# Patient Record
Sex: Male | Born: 1935 | Race: White | Hispanic: No | Smoking: Never smoker
Health system: Southern US, Community
[De-identification: ages and names within clinical notes are randomized; demographics above are authoritative.]

## PROBLEM LIST (undated history)

## (undated) DIAGNOSIS — C689 Malignant neoplasm of urinary organ, unspecified: Secondary | ICD-10-CM

## (undated) DIAGNOSIS — N400 Enlarged prostate without lower urinary tract symptoms: Secondary | ICD-10-CM

## (undated) DIAGNOSIS — I480 Paroxysmal atrial fibrillation: Secondary | ICD-10-CM

## (undated) DIAGNOSIS — H409 Unspecified glaucoma: Secondary | ICD-10-CM

## (undated) DIAGNOSIS — N189 Chronic kidney disease, unspecified: Secondary | ICD-10-CM

## (undated) DIAGNOSIS — M48061 Spinal stenosis, lumbar region without neurogenic claudication: Secondary | ICD-10-CM

## (undated) DIAGNOSIS — I10 Essential (primary) hypertension: Secondary | ICD-10-CM

## (undated) DIAGNOSIS — E119 Type 2 diabetes mellitus without complications: Secondary | ICD-10-CM

## (undated) DIAGNOSIS — N184 Chronic kidney disease, stage 4 (severe): Secondary | ICD-10-CM

## (undated) HISTORY — DX: Type 2 diabetes mellitus without complications: E11.9

## (undated) HISTORY — PX: EYE SURGERY: SHX253

## (undated) HISTORY — PX: APPENDECTOMY: SHX54

## (undated) HISTORY — PX: TRIGGER FINGER RELEASE: SHX641

## (undated) HISTORY — PX: KNEE SURGERY: SHX244

---

## 1994-04-16 HISTORY — PX: SHOULDER SURGERY: SHX246

## 2008-03-21 ENCOUNTER — Emergency Department: Payer: Self-pay | Admitting: Emergency Medicine

## 2008-03-21 IMAGING — CR DG HUMERUS 2V *L*
1 series · 3 of 3 positions shown · non-contrast
Comparison: none

REASON FOR EXAM: pain, mid shaft deformity
COMMENTS:

PROCEDURE:     DXR - DXR HUMERUS LEFT  - [DATE] [DATE]
RESULT:     A transverse fracture is identified along the midhumeral shaft.
Distal fracture fragment demonstrates lateral angulation and minimal lateral
displacement.  There is approximately 3-6 mm of distraction.

[Series 1: view not recorded · 0.17mm/px · 3 of 3 slices shown]
[im 1/3]
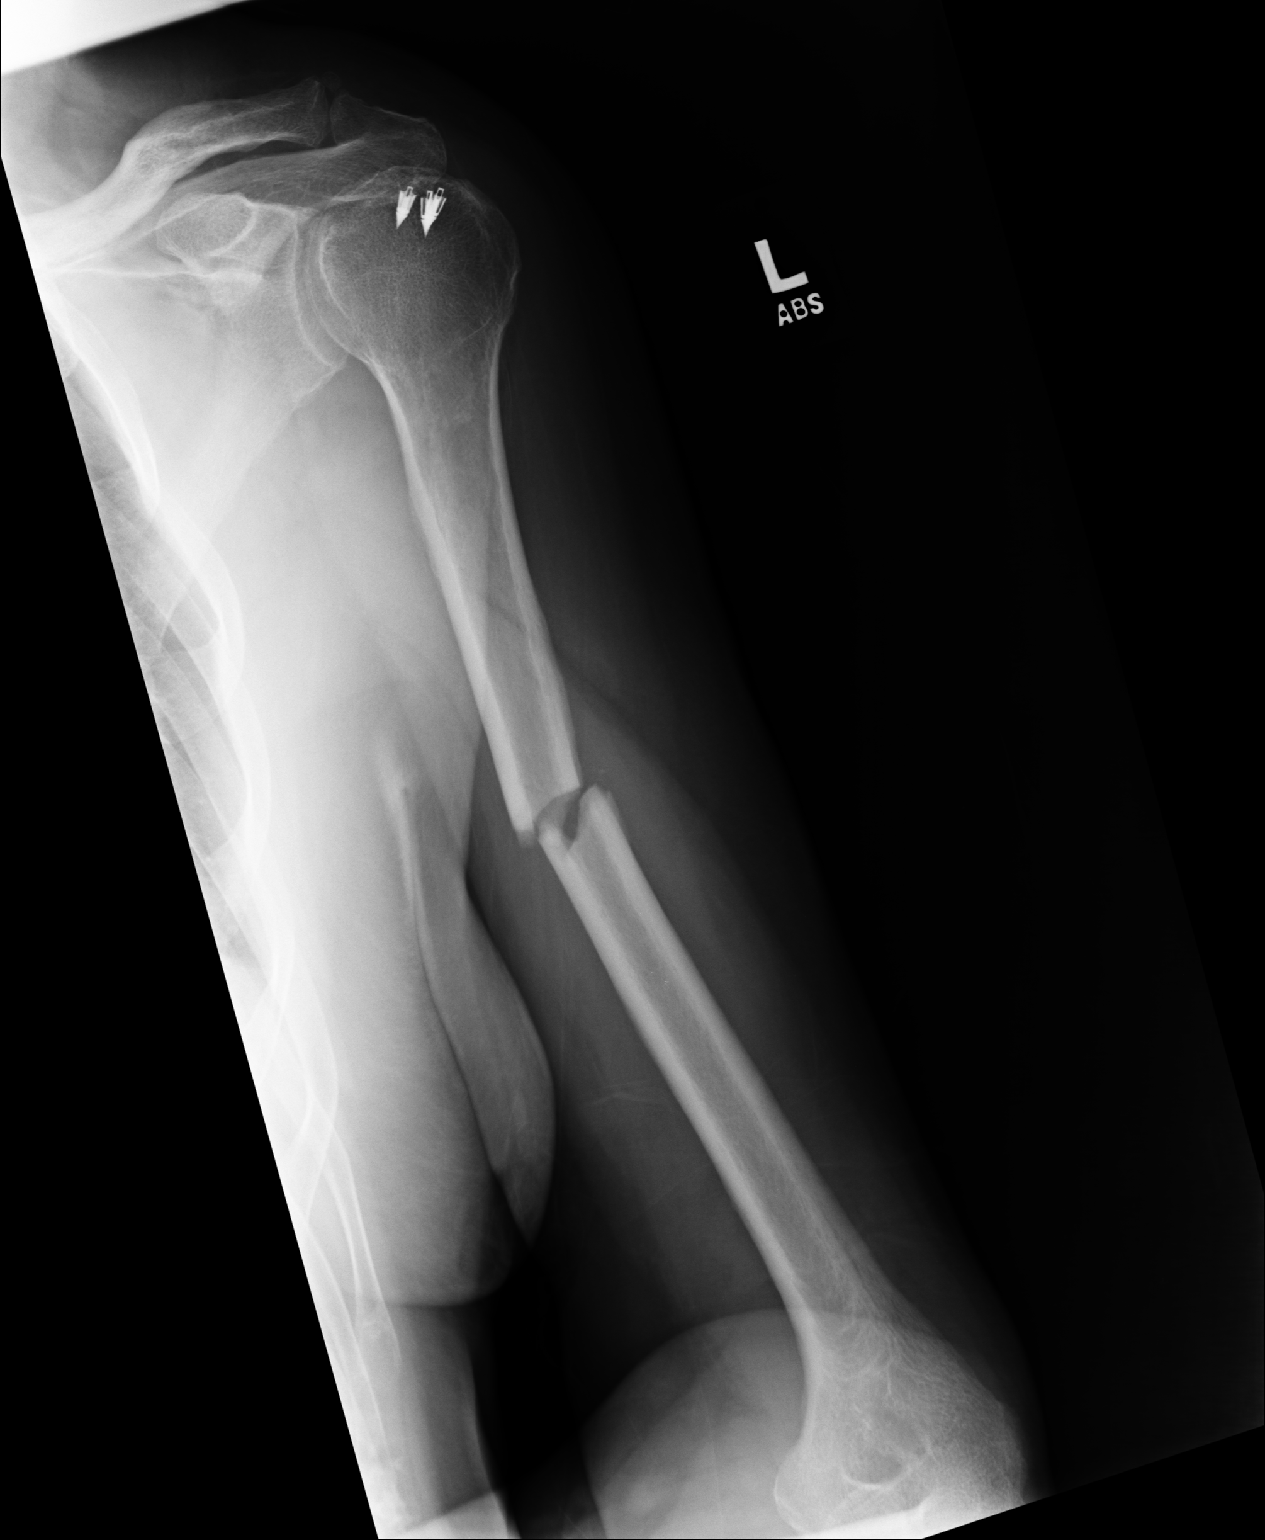
[im 2/3]
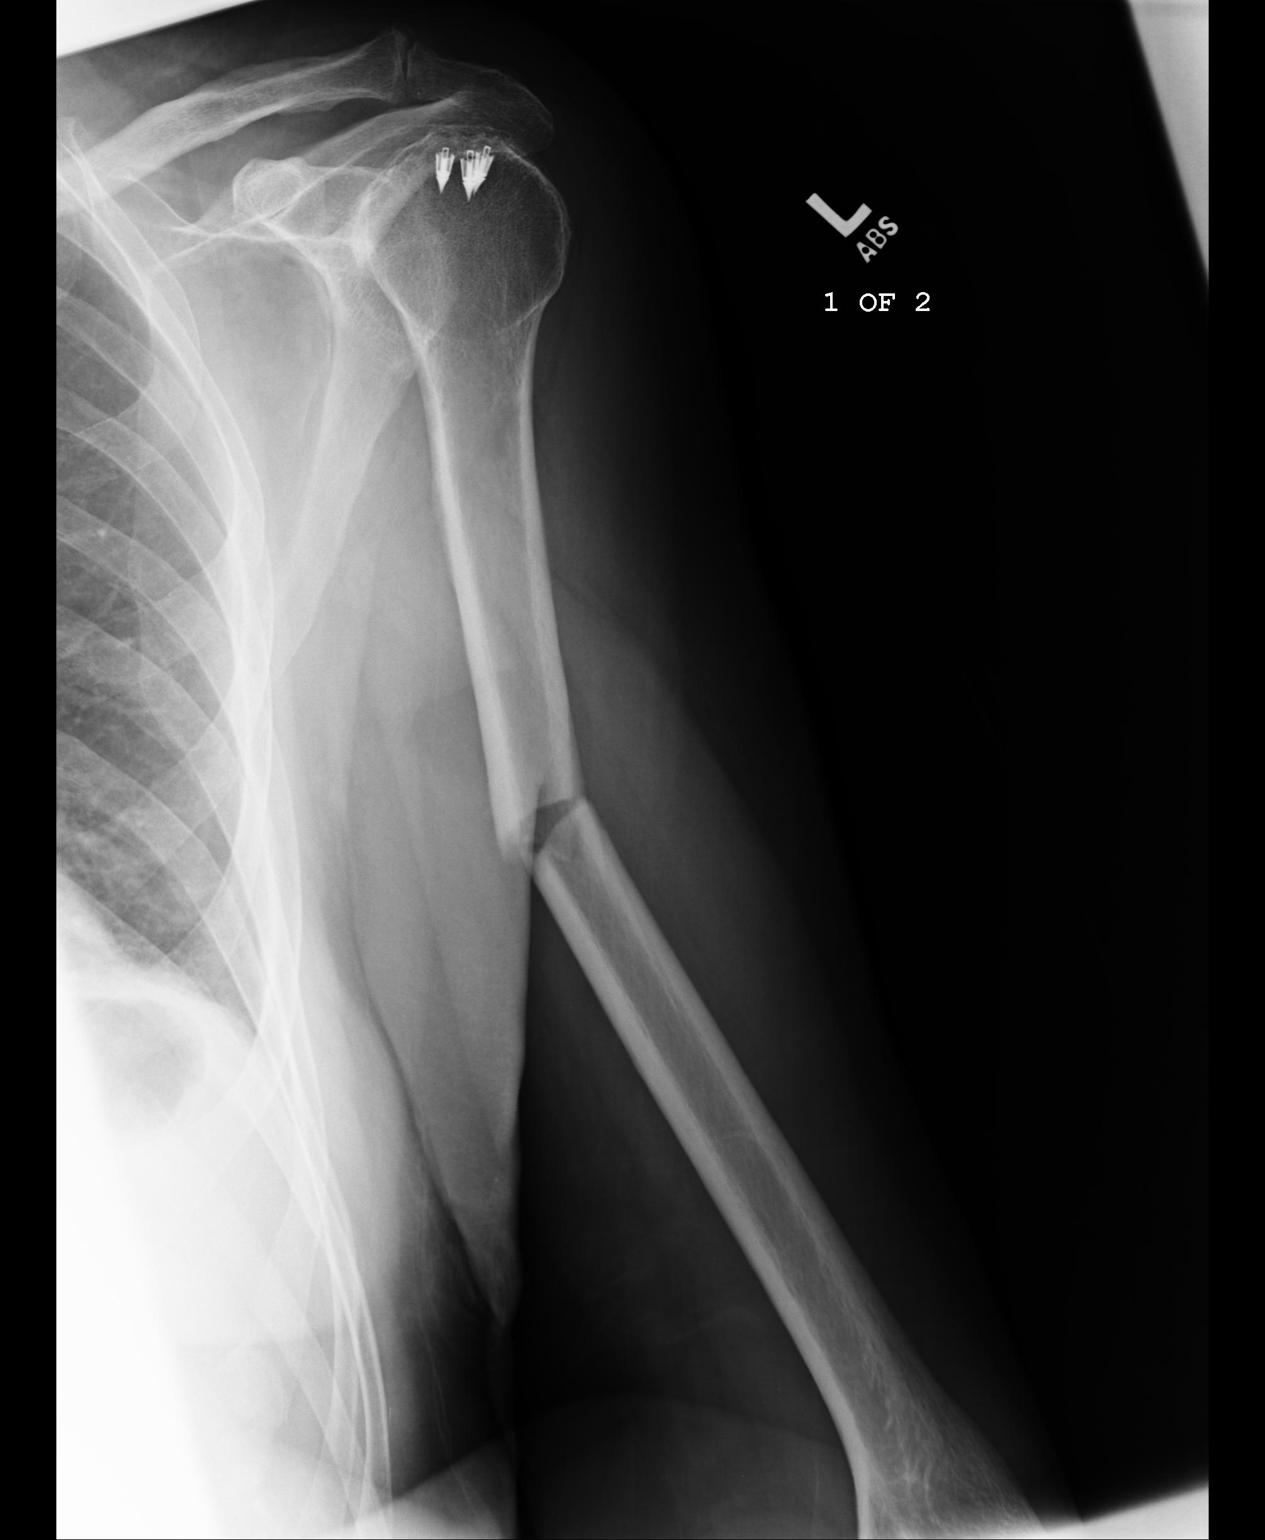
[im 3/3]
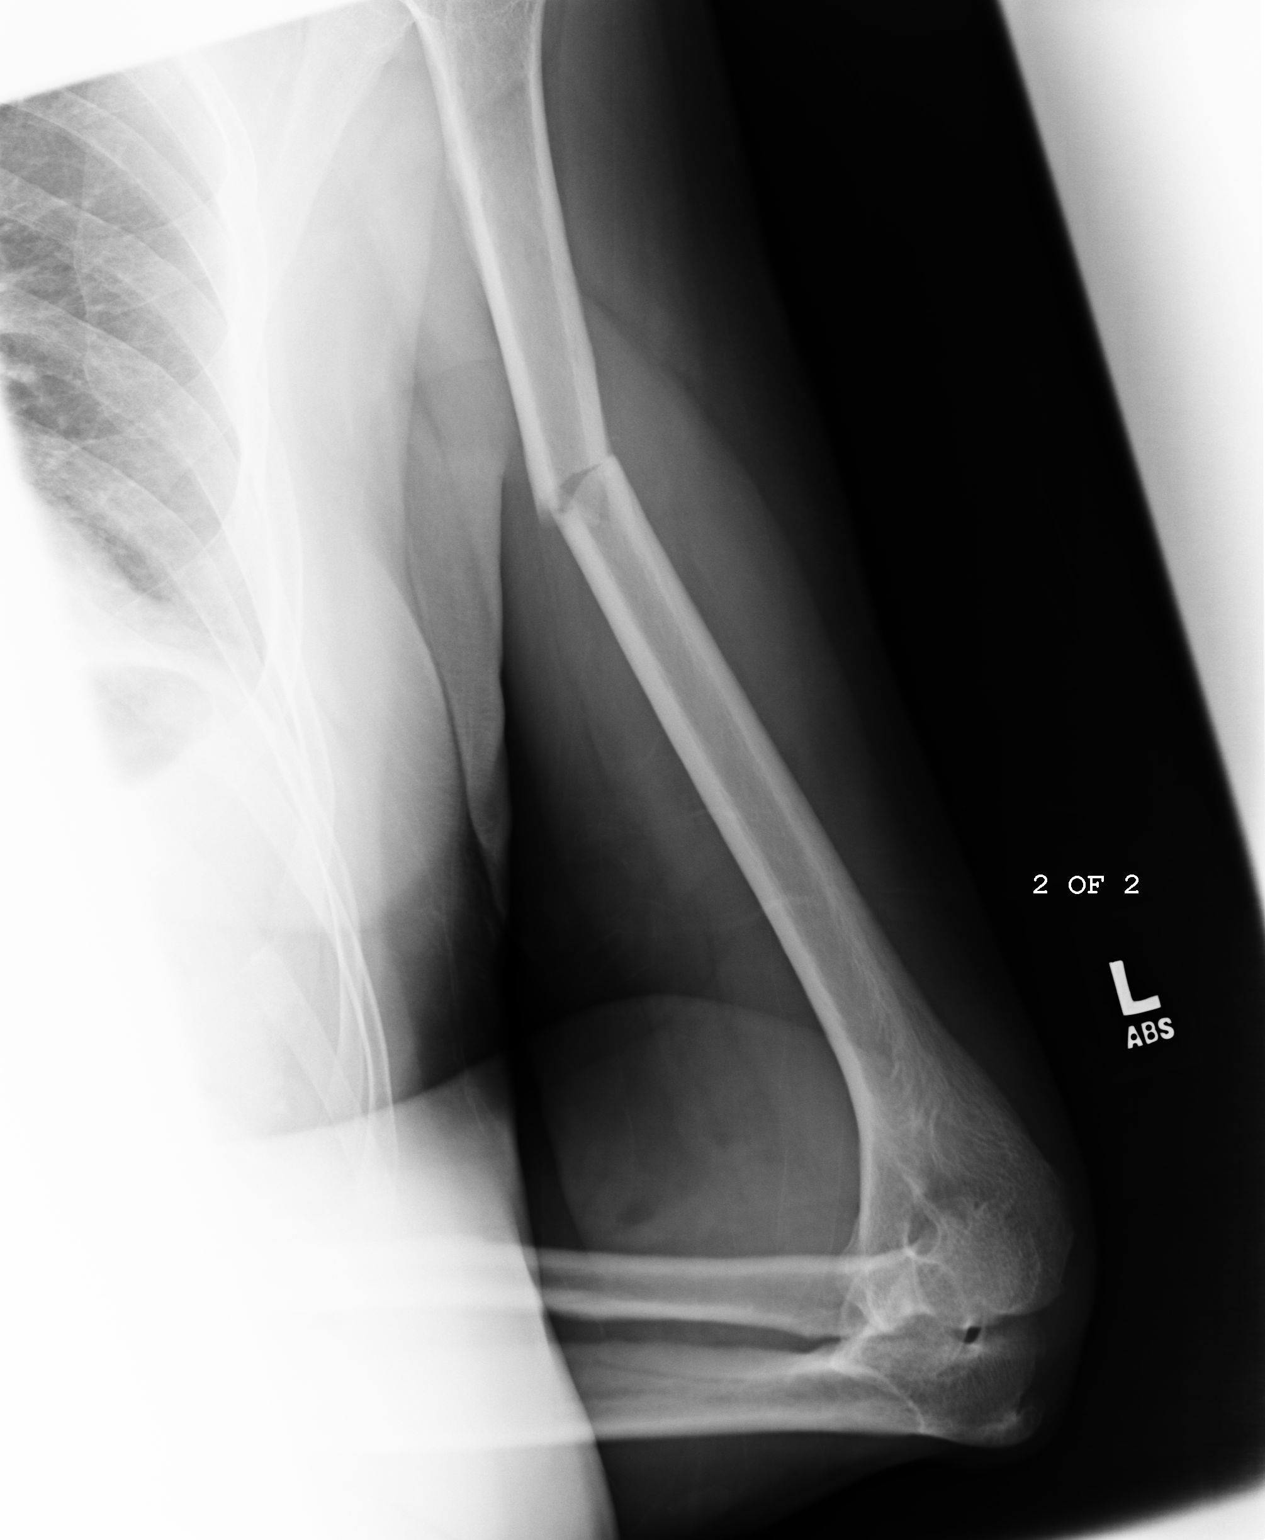

[3 of 3 positions shown; findings below may reference images not displayed]

IMPRESSION: Mid humeral shaft fracture as described above.

## 2014-01-07 DIAGNOSIS — M48061 Spinal stenosis, lumbar region without neurogenic claudication: Secondary | ICD-10-CM | POA: Insufficient documentation

## 2014-01-07 DIAGNOSIS — I10 Essential (primary) hypertension: Secondary | ICD-10-CM | POA: Insufficient documentation

## 2014-05-01 ENCOUNTER — Emergency Department: Payer: Self-pay | Admitting: Emergency Medicine

## 2014-05-01 LAB — URINALYSIS, COMPLETE
Bacteria: NONE SEEN
Bilirubin,UR: NEGATIVE
Blood: NEGATIVE
Glucose,UR: >=500 mg/dL (ref 0–75)
Ketone: NEGATIVE
Leukocyte Esterase: NEGATIVE
Nitrite: NEGATIVE
Ph: 7 (ref 4.5–8.0)
Protein: NEGATIVE
RBC,UR: 1 /HPF (ref 0–5)
Specific Gravity: 1.009 (ref 1.003–1.030)
Squamous Epithelial: NONE SEEN
WBC UR: <1 /HPF (ref 0–5)

## 2014-05-01 LAB — CBC WITH DIFFERENTIAL/PLATELET
Basophil #: 0.1 10*3/uL (ref 0.0–0.1)
Basophil %: 1 %
Eosinophil #: 0.1 10*3/uL (ref 0.0–0.7)
Eosinophil %: 0.9 %
HCT: 45.4 % (ref 40.0–52.0)
HGB: 15.1 g/dL (ref 13.0–18.0)
Lymphocyte #: 1.3 10*3/uL (ref 1.0–3.6)
Lymphocyte %: 13.5 %
MCH: 30.9 pg (ref 26.0–34.0)
MCHC: 33.4 g/dL (ref 32.0–36.0)
MCV: 93 fL (ref 80–100)
Monocyte #: 0.7 x10 3/mm (ref 0.2–1.0)
Monocyte %: 7.6 %
Neutrophil #: 7.5 10*3/uL — ABNORMAL HIGH (ref 1.4–6.5)
Neutrophil %: 77 %
Platelet: 247 10*3/uL (ref 150–440)
RBC: 4.89 10*6/uL (ref 4.40–5.90)
RDW: 13.1 % (ref 11.5–14.5)
WBC: 9.7 10*3/uL (ref 3.8–10.6)

## 2014-05-01 LAB — BASIC METABOLIC PANEL
Anion Gap: 6 — ABNORMAL LOW (ref 7–16)
BUN: 14 mg/dL (ref 7–18)
Calcium, Total: 8.8 mg/dL (ref 8.5–10.1)
Chloride: 98 mmol/L (ref 98–107)
Co2: 31 mmol/L (ref 21–32)
Creatinine: 1.08 mg/dL (ref 0.60–1.30)
EGFR (African American): >60
EGFR (Non-African Amer.): >60
Glucose: 257 mg/dL — ABNORMAL HIGH (ref 65–99)
Osmolality: 279 (ref 275–301)
Potassium: 4.4 mmol/L (ref 3.5–5.1)
Sodium: 135 mmol/L — ABNORMAL LOW (ref 136–145)

## 2014-05-01 LAB — PRO B NATRIURETIC PEPTIDE: B-Type Natriuretic Peptide: 135 pg/mL (ref 0–450)

## 2014-05-01 LAB — TROPONIN I: Troponin-I: <0.02 ng/mL

## 2014-05-01 IMAGING — CT CT HEAD WITHOUT CONTRAST
1 series · 16 of 30 positions shown, 20 images · non-contrast
Comparison: None.

CLINICAL DATA: Dizziness upon standing. Gait disturbance earlier
today.

EXAM:
CT HEAD WITHOUT CONTRAST
TECHNIQUE: Contiguous axial images were obtained from the base of the skull
through the vertex without intravenous contrast.

[Series 2: head wo · axial · 0.42mm/px · z∈[-112,+14]mm · 16 of 32 slices shown, 20 images]
[im 2/32  brain]
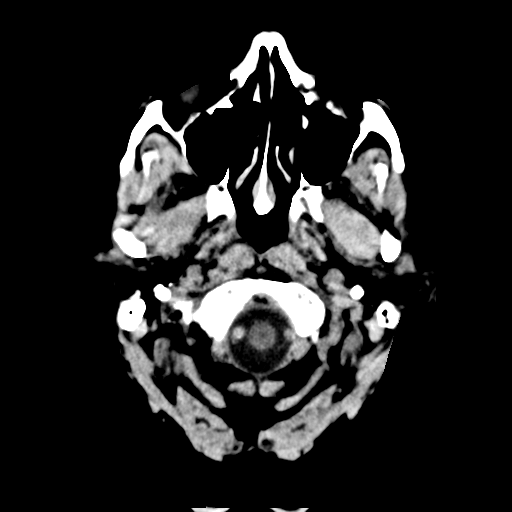
[im 2/32  bone]
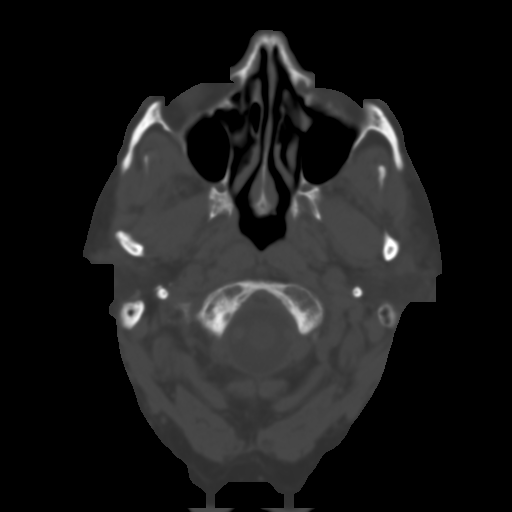
[im 4/32  brain]
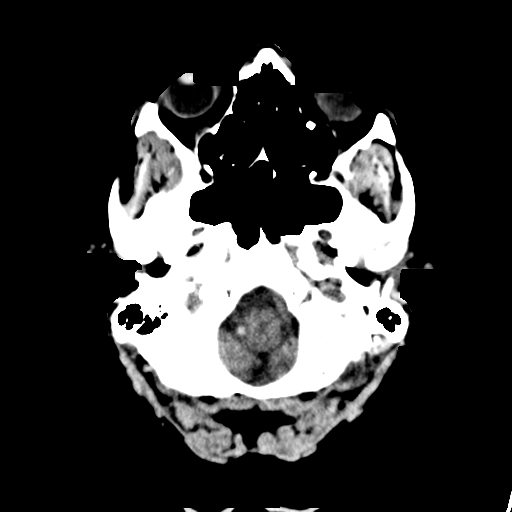
[im 6/32  brain]
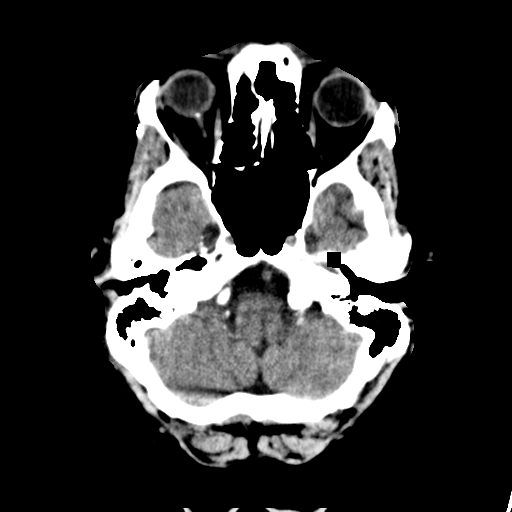
[im 8/32  brain]
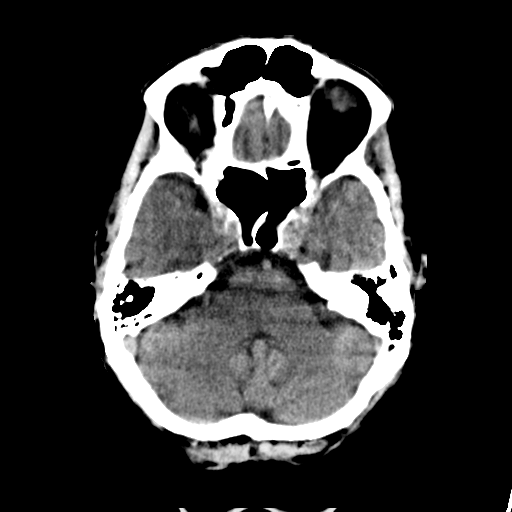
[im 9/32  brain]
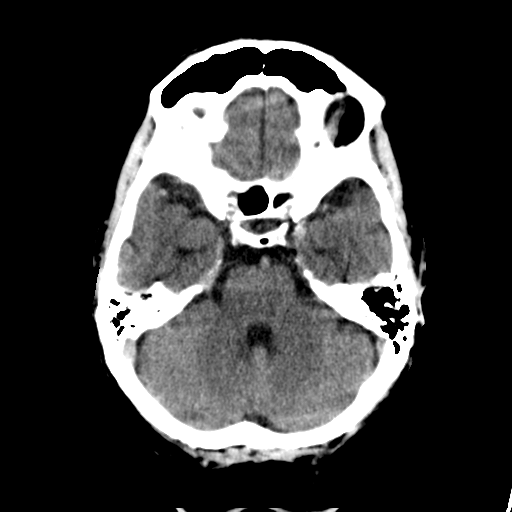
[im 9/32  bone]
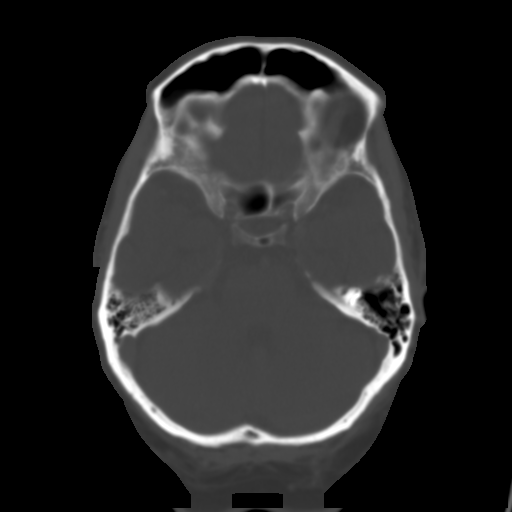
[im 11/32  brain]
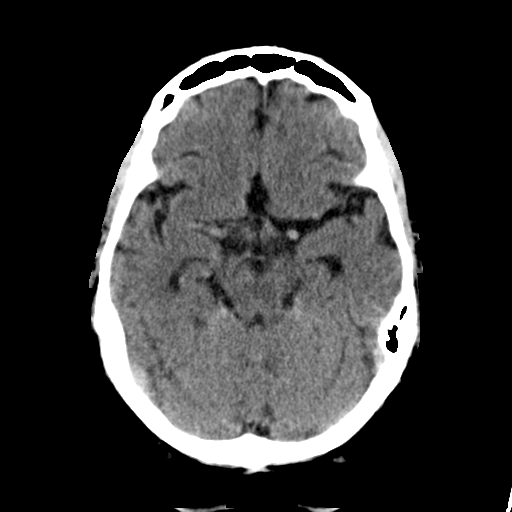
[im 13/32  brain]
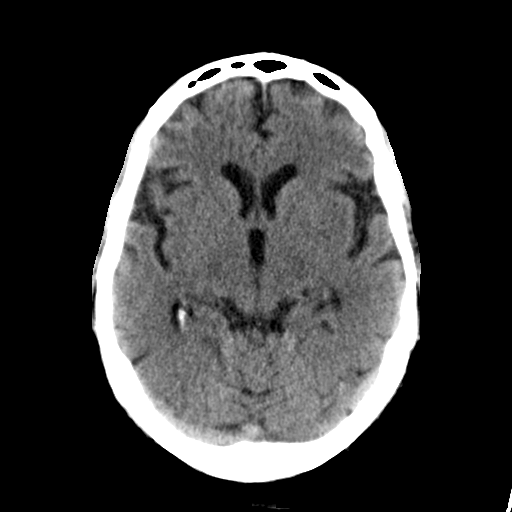
[im 15/32  brain]
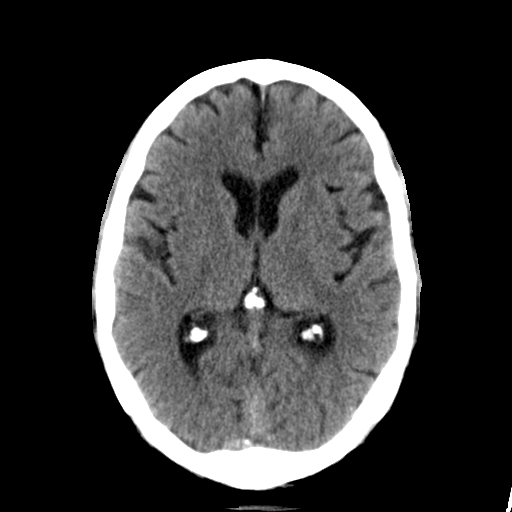
[im 17/32  brain]
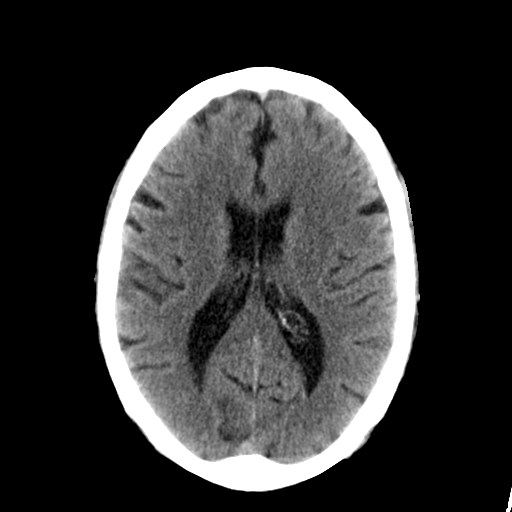
[im 17/32  bone]
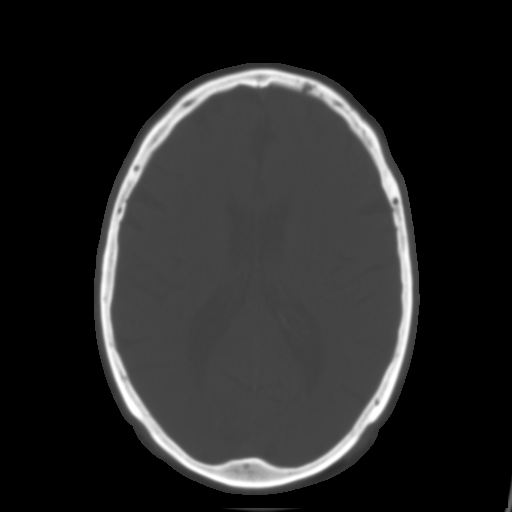
[im 19/32  brain]
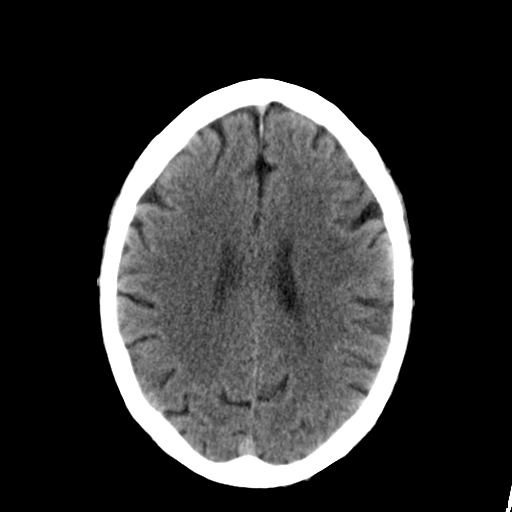
[im 21/32  brain]
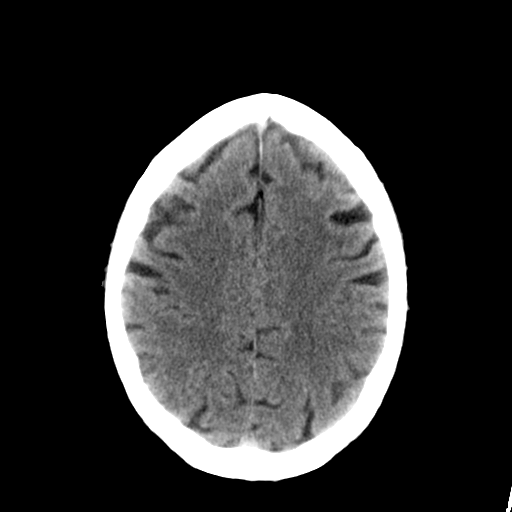
[im 23/32  brain]
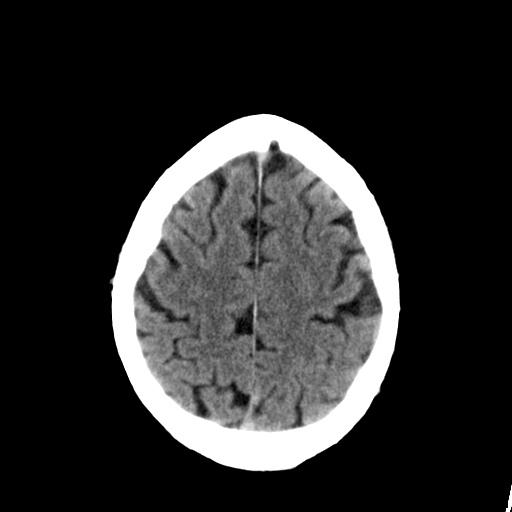
[im 24/32  brain]
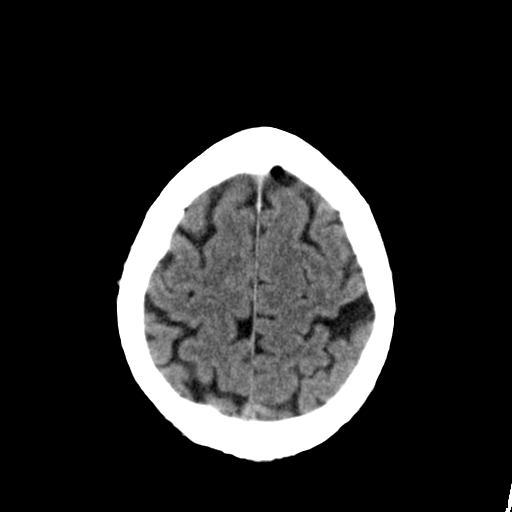
[im 24/32  bone]
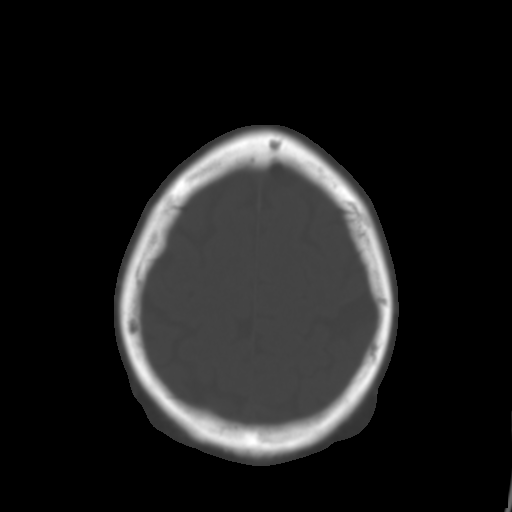
[im 26/32  brain]
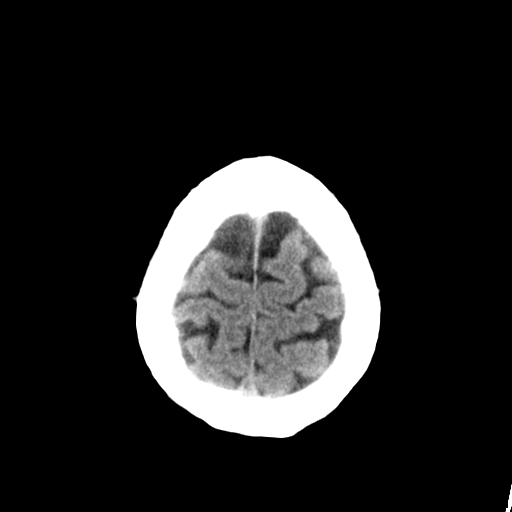
[im 28/32  brain]
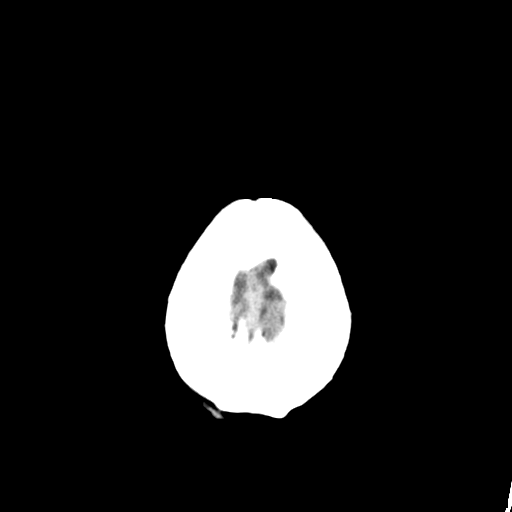
[im 30/32  brain]
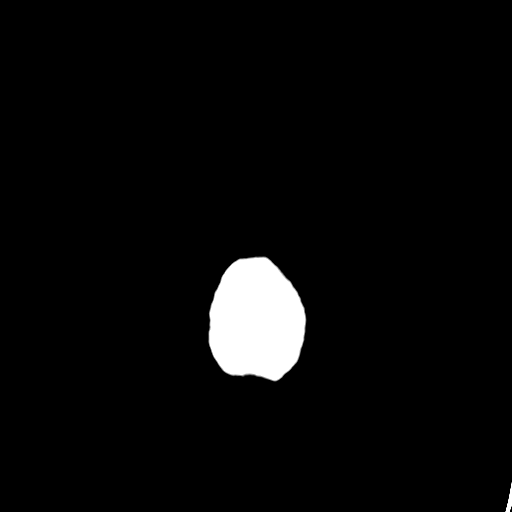

[16 of 30 positions shown; findings below may reference images not displayed]

FINDINGS: Ventricular system normal in size and appearance for age. Mild age
related cortical atrophy. No mass lesion. No midline shift. No acute
hemorrhage or hematoma. No extra-axial fluid collections. No
evidence of acute infarction. No focal brain parenchymal
abnormality.

No skull fracture or other focal osseous abnormality involving the
skull. Visualized paranasal sinuses, bilateral mastoid air cells and
bilateral middle ear cavities well-aerated. Mild bilateral carotid
siphon and vertebral artery atherosclerosis.
IMPRESSION: 1. No acute intracranial abnormality.
2. Mild age related cortical atrophy.

## 2014-05-01 IMAGING — CR DG CHEST 1V PORT
1 series · 1 of 1 positions shown · non-contrast
Comparison: None.

CLINICAL DATA: Dizziness while standing. Nonproductive cough for
several days. History of hypertension and diabetes.

EXAM:
PORTABLE CHEST - 1 VIEW

[dxr portable chest single view]
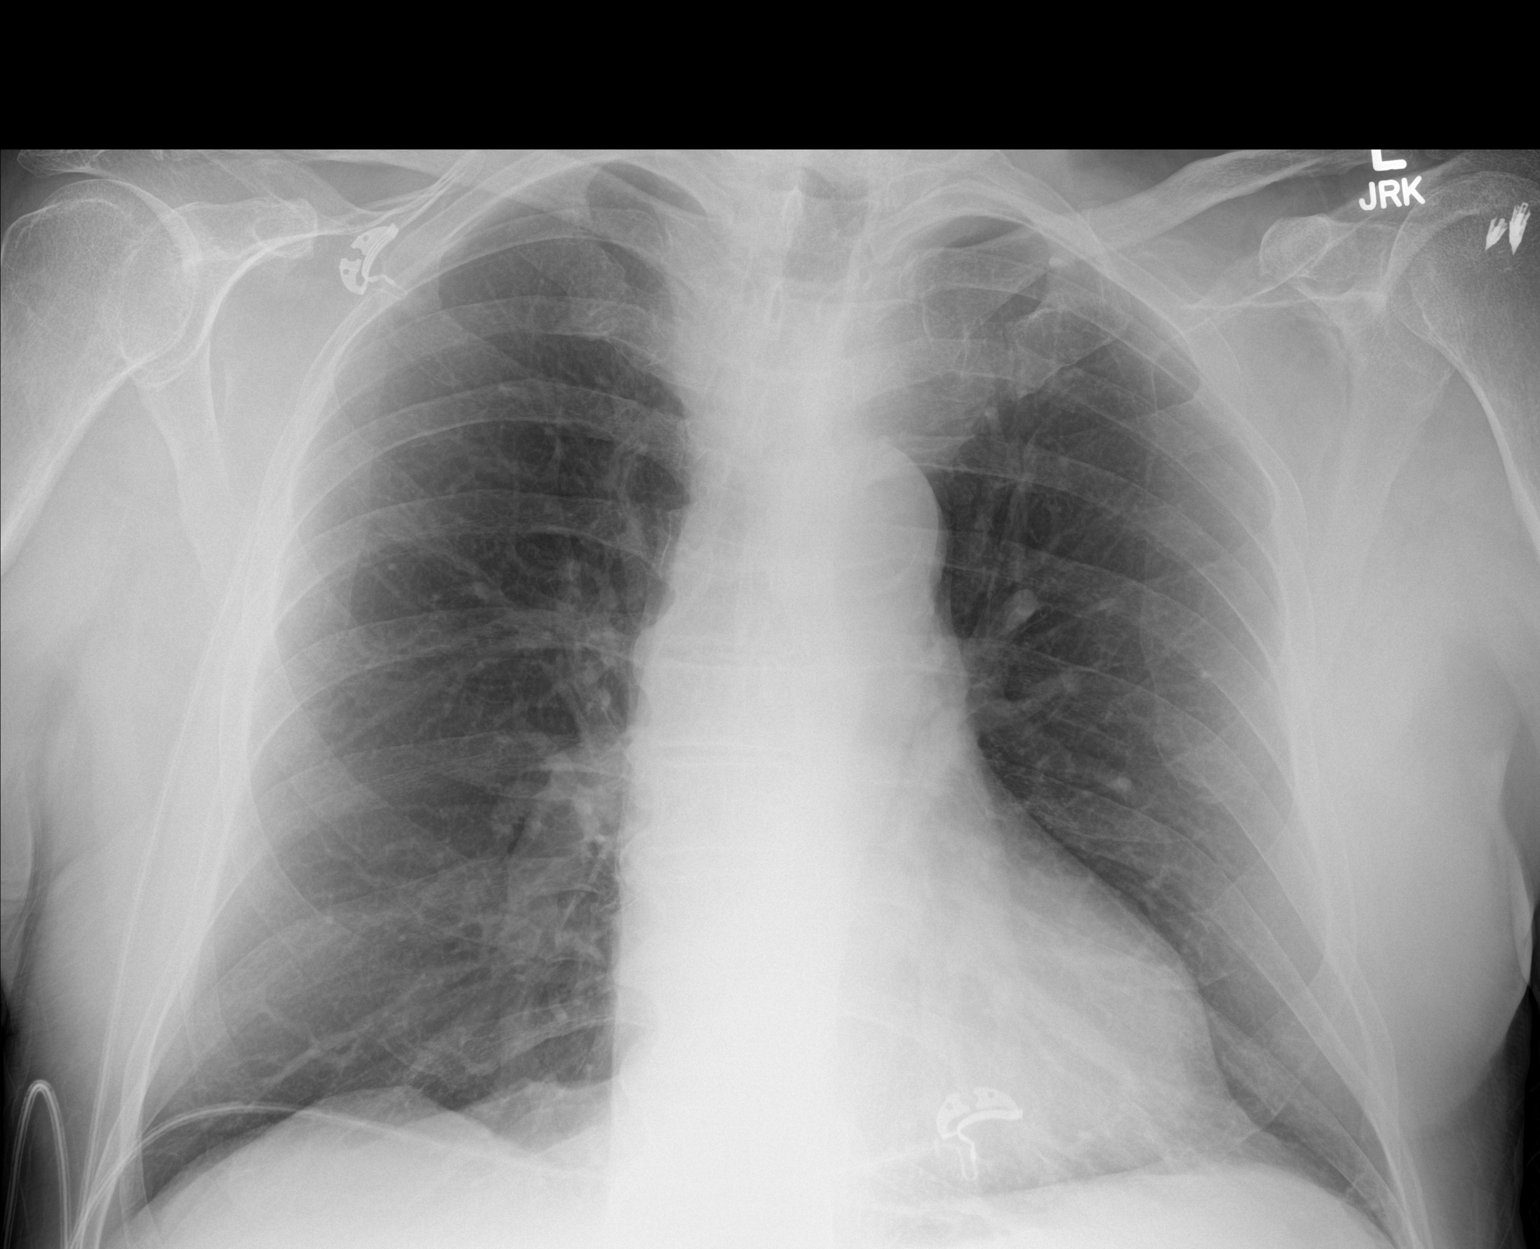

[1 of 1 positions shown; findings below may reference images not displayed]

FINDINGS: Mild hyperinflation of the lungs. Heart and mediastinal contours are
within normal limits. No focal opacities or effusions. No acute bony
abnormality.
IMPRESSION: Hyperinflation.  No active disease.

## 2014-07-28 ENCOUNTER — Ambulatory Visit
Admit: 2014-07-28 | Disposition: A | Discharge status: Home / Self Care | Payer: Self-pay | Attending: Ophthalmology | Admitting: Ophthalmology

## 2016-07-17 DIAGNOSIS — E1121 Type 2 diabetes mellitus with diabetic nephropathy: Secondary | ICD-10-CM | POA: Insufficient documentation

## 2016-12-06 ENCOUNTER — Ambulatory Visit (INDEPENDENT_AMBULATORY_CARE_PROVIDER_SITE_OTHER): Payer: Medicare Other | Admitting: Podiatry

## 2016-12-06 ENCOUNTER — Encounter: Payer: Self-pay | Admitting: Podiatry

## 2016-12-06 VITALS — BP 170/86 | HR 63 | Ht 70.0 in | Wt 230.0 lb

## 2016-12-06 DIAGNOSIS — M205X1 Other deformities of toe(s) (acquired), right foot: Secondary | ICD-10-CM | POA: Diagnosis not present

## 2016-12-06 DIAGNOSIS — E1142 Type 2 diabetes mellitus with diabetic polyneuropathy: Secondary | ICD-10-CM

## 2016-12-06 DIAGNOSIS — M205X2 Other deformities of toe(s) (acquired), left foot: Secondary | ICD-10-CM | POA: Diagnosis not present

## 2016-12-06 NOTE — Progress Notes (Signed)
   Subjective:    Patient ID: Timothy Duke, male    DOB: [birth year], 83 y.o.   MRN: [id number]  HPI this patient presents the office stating that he is diabetic.  He says he needs to acquire a new pair of diabetic shoes.  He says that he received his last shoes from [employer] in [place].  He says they no longer provide diabetic shoes.  He was seen by his medical doctor, Emily Filbert occur noted clinic who advised him to present to this office to obtain a new pair of diabetic shoes. Patient says he has been diabetic for 30 years  He says that he has no foot problems and presents the office today for diabetic foot exam as well as diabetic shoes    Review of Systems  All other systems reviewed and are negative.      Objective:   Physical Exam GENERAL APPEARANCE: Alert, conversant. Appropriately groomed. No acute distress.  VASCULAR: Pedal pulses are  palpable at  [facility] and PT bilateral.  Capillary refill time is immediate to all digits,  Normal temperature gradient.  Digital hair growth is present bilateral  NEUROLOGIC: sensation is diminished  to 5.07 monofilament at 5/5 sites bilateral.  Light touch is intact bilateral, Muscle strength normal.  MUSCULOSKELETAL: acceptable muscle strength, tone and stability bilateral.  Intrinsic muscluature intact bilateral.  Hallux limitus 1st MPJ  B/L  DERMATOLOGIC: skin color, texture, and turgor are within normal limits.  No preulcerative lesions or ulcers  are seen, no interdigital maceration noted.  No open lesions present.  Digital nails are thick and disfigured  but  asymptomatic. No drainage noted.         Assessment & Plan:  Diabetic neuropathy   . Hallux limitus first MPJ bilateral  IE.  Diabetic foot exam was performed.  Patient was also casted for a new pair of diabetic shoes.  He will be called to pick up the shoes when they arrive.   Gardiner Barefoot DPM

## 2017-01-10 ENCOUNTER — Ambulatory Visit: Payer: Medicare Other | Admitting: Podiatry

## 2017-01-10 ENCOUNTER — Ambulatory Visit (INDEPENDENT_AMBULATORY_CARE_PROVIDER_SITE_OTHER): Payer: Medicare Other | Admitting: Podiatry

## 2017-01-10 DIAGNOSIS — M205X1 Other deformities of toe(s) (acquired), right foot: Secondary | ICD-10-CM

## 2017-01-10 DIAGNOSIS — M205X2 Other deformities of toe(s) (acquired), left foot: Secondary | ICD-10-CM

## 2017-01-10 DIAGNOSIS — E1142 Type 2 diabetes mellitus with diabetic polyneuropathy: Secondary | ICD-10-CM

## 2017-01-10 NOTE — Patient Instructions (Signed)

## 2017-01-10 NOTE — Progress Notes (Signed)
Patient presents for diabetic shoe pick up, shoes are tried on for good fit.  Patient received 1 Pair and 3 pairs custom molded diabetic inserts.  Verbal and written break in and wear instructions given.  Patient will follow up for scheduled routine care.   Diagnosis  DPN  Hallux Limitus 1st MPJ  B/L.  Dispense diabetic shoes.  Patient presents today and was dispensed 0ne pair ( two units) of medically necessary extra depth shoes with three pair( six units) of custom molded multiple density inserts. The shoes and the inserts are fitted to the patients ' feet and are noted to fit well and are free of defect.  Length and width of the shoes are also acceptable.  Patient was given written and verbal  instructions for wearing.  If any concerns arrive with the shoes or inserts, the patient is to call the office.Patient is to follow up with doctor in six weeks.   Gardiner Barefoot DPM

## 2017-01-14 ENCOUNTER — Ambulatory Visit: Payer: Medicare Other | Admitting: Podiatry

## 2017-07-24 DIAGNOSIS — Z0000 Encounter for general adult medical examination without abnormal findings: Secondary | ICD-10-CM | POA: Insufficient documentation

## 2017-07-24 DIAGNOSIS — E1121 Type 2 diabetes mellitus with diabetic nephropathy: Secondary | ICD-10-CM | POA: Insufficient documentation

## 2018-02-17 ENCOUNTER — Encounter: Payer: Self-pay | Admitting: Podiatry

## 2018-02-17 ENCOUNTER — Ambulatory Visit (INDEPENDENT_AMBULATORY_CARE_PROVIDER_SITE_OTHER): Payer: Medicare Other | Admitting: Podiatry

## 2018-02-17 DIAGNOSIS — M205X2 Other deformities of toe(s) (acquired), left foot: Secondary | ICD-10-CM

## 2018-02-17 DIAGNOSIS — M79675 Pain in left toe(s): Secondary | ICD-10-CM | POA: Diagnosis not present

## 2018-02-17 DIAGNOSIS — E1142 Type 2 diabetes mellitus with diabetic polyneuropathy: Secondary | ICD-10-CM

## 2018-02-17 DIAGNOSIS — B351 Tinea unguium: Secondary | ICD-10-CM | POA: Diagnosis not present

## 2018-02-17 DIAGNOSIS — M79674 Pain in right toe(s): Secondary | ICD-10-CM | POA: Diagnosis not present

## 2018-02-17 NOTE — Progress Notes (Signed)
Complaint:  Visit Type: Patient returns to my office for continued preventative foot care services. Complaint: Patient states" my nails have grown long and thick and become painful to walk and wear shoes" Patient has been diagnosed with DM with no foot complications. The patient presents for preventative foot care services. No changes to ROS.  Patient has not been seen in over 1 year.  Podiatric Exam: Vascular: dorsalis pedis and posterior tibial pulses are palpable bilateral. Capillary return is immediate. Temperature gradient is WNL. Skin turgor WNL  Sensorium: Normal Semmes Weinstein monofilament test. Normal tactile sensation bilaterally. Nail Exam: Pt has thick disfigured discolored nails with subungual debris noted bilateral entire nail hallux through fifth toenails Ulcer Exam: There is no evidence of ulcer or pre-ulcerative changes or infection. Orthopedic Exam: Muscle tone and strength are WNL. No limitations in general ROM. No crepitus or effusions noted. Foot type and digits show no abnormalities. Bony prominences are unremarkable. Skin: No Porokeratosis. No infection or ulcers  Diagnosis:  Onychomycosis, , Pain in right toe, pain in left toes  Treatment & Plan Procedures and Treatment: Consent by patient was obtained for treatment procedures.   Debridement of mycotic and hypertrophic toenails, 1 through 5 bilateral and clearing of subungual debris. No ulceration, no infection noted. Initiate diabetic paperwork for DPN and hallux limitus  B/L. To acquire same diabetic shoes as received last year but in  [place]. Return Visit-Office Procedure: Patient instructed to return to the office for a follow up visit 3 months for continued evaluation and treatment.    Gardiner Barefoot DPM

## 2018-03-19 ENCOUNTER — Ambulatory Visit: Payer: Medicare Other | Admitting: Orthotics

## 2018-03-19 DIAGNOSIS — B351 Tinea unguium: Secondary | ICD-10-CM

## 2018-03-19 DIAGNOSIS — M205X2 Other deformities of toe(s) (acquired), left foot: Secondary | ICD-10-CM

## 2018-03-19 DIAGNOSIS — M79674 Pain in right toe(s): Principal | ICD-10-CM

## 2018-03-19 DIAGNOSIS — E1142 Type 2 diabetes mellitus with diabetic polyneuropathy: Secondary | ICD-10-CM

## 2018-03-19 DIAGNOSIS — M205X1 Other deformities of toe(s) (acquired), right foot: Secondary | ICD-10-CM

## 2018-03-19 DIAGNOSIS — M79675 Pain in left toe(s): Principal | ICD-10-CM

## 2018-03-21 NOTE — Progress Notes (Signed)

## 2018-05-19 ENCOUNTER — Encounter: Payer: Self-pay | Admitting: Podiatry

## 2018-05-19 ENCOUNTER — Ambulatory Visit (INDEPENDENT_AMBULATORY_CARE_PROVIDER_SITE_OTHER): Payer: Medicare Other | Admitting: Podiatry

## 2018-05-19 DIAGNOSIS — B351 Tinea unguium: Secondary | ICD-10-CM | POA: Diagnosis not present

## 2018-05-19 DIAGNOSIS — M205X2 Other deformities of toe(s) (acquired), left foot: Secondary | ICD-10-CM | POA: Diagnosis not present

## 2018-05-19 DIAGNOSIS — E1142 Type 2 diabetes mellitus with diabetic polyneuropathy: Secondary | ICD-10-CM

## 2018-05-19 DIAGNOSIS — M205X1 Other deformities of toe(s) (acquired), right foot: Secondary | ICD-10-CM

## 2018-05-19 DIAGNOSIS — M79675 Pain in left toe(s): Secondary | ICD-10-CM

## 2018-05-19 DIAGNOSIS — M79674 Pain in right toe(s): Secondary | ICD-10-CM

## 2018-05-19 NOTE — Progress Notes (Signed)
Complaint:  Visit Type: Patient returns to my office for continued preventative foot care services. Complaint: Patient states" my nails have grown long and thick and become painful to walk and wear shoes" Patient has been diagnosed with DM with no foot complications. The patient presents for preventative foot care services. No changes to ROS.    Podiatric Exam: Vascular: dorsalis pedis and posterior tibial pulses are palpable bilateral. Capillary return is immediate. Temperature gradient is WNL. Skin turgor WNL  Sensorium: Diminished  Semmes Weinstein monofilament test. Normal tactile sensation bilaterally. Nail Exam: Pt has thick disfigured discolored nails with subungual debris noted bilateral entire nail hallux through fifth toenails Ulcer Exam: There is no evidence of ulcer or pre-ulcerative changes or infection. Orthopedic Exam: Muscle tone and strength are WNL. No limitations in general ROM. No crepitus or effusions noted. Foot type and digits show no abnormalities. Hallux limitus  B/L. Skin: No Porokeratosis. No infection or ulcers  Diagnosis:  Onychomycosis, , Pain in right toe, pain in left toes  Treatment & Plan Procedures and Treatment: Consent by patient was obtained for treatment procedures.   Debridement of mycotic and hypertrophic toenails, 1 through 5 bilateral and clearing of subungual debris. No ulceration, no infection noted. Dispense diabetic shoes.  Patient presents today and was dispensed 0ne pair ( two units) of medically necessary extra depth shoes with three pair( six units) of custom molded multiple density inserts. The shoes and the inserts are fitted to the patients ' feet and are noted to fit well and are free of defect.  Length and width of the shoes are also acceptable.  Patient was given written and verbal  instructions for wearing.  If any concerns arrive with the shoes or inserts, the patient is to call the office.Patient is to follow up with doctor in six  weeks. Return Visit-Office Procedure: Patient instructed to return to the office for a follow up visit 3 months for continued evaluation and treatment.    Elianny Buxbaum DPM 

## 2018-05-21 ENCOUNTER — Other Ambulatory Visit: Payer: Medicare Other | Admitting: Orthotics

## 2018-08-18 ENCOUNTER — Ambulatory Visit: Payer: Medicare Other | Admitting: Podiatry

## 2018-09-04 ENCOUNTER — Encounter: Payer: Self-pay | Admitting: Podiatry

## 2018-09-04 ENCOUNTER — Ambulatory Visit (INDEPENDENT_AMBULATORY_CARE_PROVIDER_SITE_OTHER): Payer: Medicare Other | Admitting: Podiatry

## 2018-09-04 ENCOUNTER — Other Ambulatory Visit: Payer: Self-pay

## 2018-09-04 VITALS — Temp 96.8°F

## 2018-09-04 DIAGNOSIS — M79674 Pain in right toe(s): Secondary | ICD-10-CM

## 2018-09-04 DIAGNOSIS — B351 Tinea unguium: Secondary | ICD-10-CM

## 2018-09-04 DIAGNOSIS — M79675 Pain in left toe(s): Secondary | ICD-10-CM

## 2018-09-04 DIAGNOSIS — E1142 Type 2 diabetes mellitus with diabetic polyneuropathy: Secondary | ICD-10-CM

## 2018-09-04 NOTE — Progress Notes (Signed)
Complaint:  Visit Type: Patient returns to my office for continued preventative foot care services. Complaint: Patient states" my nails have grown long and thick and become painful to walk and wear shoes" Patient has been diagnosed with DM with no foot complications. The patient presents for preventative foot care services. No changes to ROS.    Podiatric Exam: Vascular: dorsalis pedis and posterior tibial pulses are palpable bilateral. Capillary return is immediate. Temperature gradient is WNL. Skin turgor WNL  Sensorium: Diminished  Semmes Weinstein monofilament test. Normal tactile sensation bilaterally. Nail Exam: Pt has thick disfigured discolored nails with subungual debris noted bilateral entire nail hallux through fifth toenails Ulcer Exam: There is no evidence of ulcer or pre-ulcerative changes or infection. Orthopedic Exam: Muscle tone and strength are WNL. No limitations in general ROM. No crepitus or effusions noted. Foot type and digits show no abnormalities. Hallux limitus  B/L. Skin: No Porokeratosis. No infection or ulcers  Diagnosis:  Onychomycosis, , Pain in right toe, pain in left toes  Treatment & Plan Procedures and Treatment: Consent by patient was obtained for treatment procedures.   Debridement of mycotic and hypertrophic toenails, 1 through 5 bilateral and clearing of subungual debris. No ulceration, no infection noted. Dispense diabetic shoes.  Patient presents today and was dispensed 0ne pair ( two units) of medically necessary extra depth shoes with three pair( six units) of custom molded multiple density inserts. The shoes and the inserts are fitted to the patients ' feet and are noted to fit well and are free of defect.  Length and width of the shoes are also acceptable.  Patient was given written and verbal  instructions for wearing.  If any concerns arrive with the shoes or inserts, the patient is to call the office.Patient is to follow up with doctor in six  weeks. Return Visit-Office Procedure: Patient instructed to return to the office for a follow up visit 3 months for continued evaluation and treatment.    Gardiner Barefoot DPM

## 2018-12-04 ENCOUNTER — Other Ambulatory Visit: Payer: Self-pay

## 2018-12-04 ENCOUNTER — Ambulatory Visit (INDEPENDENT_AMBULATORY_CARE_PROVIDER_SITE_OTHER): Payer: Medicare Other | Admitting: Podiatry

## 2018-12-04 ENCOUNTER — Encounter: Payer: Self-pay | Admitting: Podiatry

## 2018-12-04 VITALS — Temp 98.0°F

## 2018-12-04 DIAGNOSIS — E1142 Type 2 diabetes mellitus with diabetic polyneuropathy: Secondary | ICD-10-CM

## 2018-12-04 DIAGNOSIS — B351 Tinea unguium: Secondary | ICD-10-CM

## 2018-12-04 DIAGNOSIS — M205X2 Other deformities of toe(s) (acquired), left foot: Secondary | ICD-10-CM | POA: Diagnosis not present

## 2018-12-04 DIAGNOSIS — M79675 Pain in left toe(s): Secondary | ICD-10-CM

## 2018-12-04 DIAGNOSIS — M205X1 Other deformities of toe(s) (acquired), right foot: Secondary | ICD-10-CM

## 2018-12-04 DIAGNOSIS — M79674 Pain in right toe(s): Secondary | ICD-10-CM

## 2018-12-04 NOTE — Progress Notes (Signed)
Complaint:  Visit Type: Patient returns to my office for continued preventative foot care services. Complaint: Patient states" my nails have grown long and thick and become painful to walk and wear shoes" Patient has been diagnosed with DM with no foot complications. The patient presents for preventative foot care services. No changes to ROS.  Patient likes his shoes.  Podiatric Exam: Vascular: dorsalis pedis and posterior tibial pulses are palpable bilateral. Capillary return is immediate. Temperature gradient is WNL. Skin turgor WNL  Sensorium: Diminished  Semmes Weinstein monofilament test. Normal tactile sensation bilaterally. Nail Exam: Pt has thick disfigured discolored nails with subungual debris noted bilateral entire nail hallux through fifth toenails Ulcer Exam: There is no evidence of ulcer or pre-ulcerative changes or infection. Orthopedic Exam: Muscle tone and strength are WNL. No limitations in general ROM. No crepitus or effusions noted. Foot type and digits show no abnormalities. Hallux limitus  B/L. Skin: No Porokeratosis. No infection or ulcers  Diagnosis:  Onychomycosis, , Pain in right toe, pain in left toes  Treatment & Plan Procedures and Treatment: Consent by patient was obtained for treatment procedures.   Debridement of mycotic and hypertrophic toenails, 1 through 5 bilateral and clearing of subungual debris. No ulceration, no infection noted..   Return Visit-Office Procedure: Patient instructed to return to the office for a follow up visit 3 months for continued evaluation and treatment.    Gardiner Barefoot DPM

## 2019-02-12 ENCOUNTER — Ambulatory Visit: Payer: Medicare Other | Admitting: Podiatry

## 2019-02-19 ENCOUNTER — Encounter: Payer: Self-pay | Admitting: Podiatry

## 2019-02-19 ENCOUNTER — Ambulatory Visit (INDEPENDENT_AMBULATORY_CARE_PROVIDER_SITE_OTHER): Payer: Medicare Other | Admitting: Podiatry

## 2019-02-19 ENCOUNTER — Other Ambulatory Visit: Payer: Self-pay

## 2019-02-19 DIAGNOSIS — B351 Tinea unguium: Secondary | ICD-10-CM | POA: Diagnosis not present

## 2019-02-19 DIAGNOSIS — M79675 Pain in left toe(s): Secondary | ICD-10-CM | POA: Diagnosis not present

## 2019-02-19 DIAGNOSIS — E1142 Type 2 diabetes mellitus with diabetic polyneuropathy: Secondary | ICD-10-CM

## 2019-02-19 DIAGNOSIS — M79674 Pain in right toe(s): Secondary | ICD-10-CM | POA: Diagnosis not present

## 2019-02-19 NOTE — Progress Notes (Signed)
Complaint:  Visit Type: Patient returns to my office for continued preventative foot care services. Complaint: Patient states" my nails have grown long and thick and become painful to walk and wear shoes" Patient has been diagnosed with DM with no foot complications. The patient presents for preventative foot care services. No changes to ROS.  Patient likes his shoes.  Podiatric Exam: Vascular: dorsalis pedis and posterior tibial pulses are palpable bilateral. Capillary return is immediate. Temperature gradient is WNL. Skin turgor WNL  Sensorium: Diminished  Semmes Weinstein monofilament test. Normal tactile sensation bilaterally. Nail Exam: Pt has thick disfigured discolored nails with subungual debris noted bilateral entire nail hallux through fifth toenails Ulcer Exam: There is no evidence of ulcer or pre-ulcerative changes or infection. Orthopedic Exam: Muscle tone and strength are WNL. No limitations in general ROM. No crepitus or effusions noted. Foot type and digits show no abnormalities. Hallux limitus  B/L. Skin: No Porokeratosis. No infection or ulcers  Diagnosis:  Onychomycosis, , Pain in right toe, pain in left toes  Treatment & Plan Procedures and Treatment: Consent by patient was obtained for treatment procedures.   Debridement of mycotic and hypertrophic toenails, 1 through 5 bilateral and clearing of subungual debris. No ulceration, no infection noted..   Return Visit-Office Procedure: Patient instructed to return to the office for a follow up visit 3 months for continued evaluation and treatment.    Jillianna Stanek DPM 

## 2019-04-30 ENCOUNTER — Encounter: Payer: Self-pay | Admitting: Podiatry

## 2019-04-30 ENCOUNTER — Ambulatory Visit (INDEPENDENT_AMBULATORY_CARE_PROVIDER_SITE_OTHER): Payer: Medicare Other | Admitting: Podiatry

## 2019-04-30 ENCOUNTER — Other Ambulatory Visit: Payer: Self-pay

## 2019-04-30 DIAGNOSIS — B351 Tinea unguium: Secondary | ICD-10-CM | POA: Diagnosis not present

## 2019-04-30 DIAGNOSIS — M79674 Pain in right toe(s): Secondary | ICD-10-CM

## 2019-04-30 DIAGNOSIS — M79675 Pain in left toe(s): Secondary | ICD-10-CM

## 2019-04-30 DIAGNOSIS — E1142 Type 2 diabetes mellitus with diabetic polyneuropathy: Secondary | ICD-10-CM

## 2019-04-30 NOTE — Progress Notes (Addendum)
Complaint:  Visit Type: Patient returns to my office for continued preventative foot care services. Complaint: Patient states" my nails have grown long and thick and become painful to walk and wear shoes" Patient has been diagnosed with DM with no foot complications. The patient presents for preventative foot care services. No changes to ROS.    Podiatric Exam: Vascular: dorsalis pedis and posterior tibial pulses are palpable bilateral. Capillary return is immediate. Temperature gradient is WNL. Skin turgor WNL  Sensorium: Diminished  Semmes Weinstein monofilament test. Normal tactile sensation bilaterally. Nail Exam: Pt has thick disfigured discolored nails with subungual debris noted bilateral entire nail hallux through fifth toenails Ulcer Exam: There is no evidence of ulcer or pre-ulcerative changes or infection. Orthopedic Exam: Muscle tone and strength are WNL. No limitations in general ROM. No crepitus or effusions noted. Foot type and digits show no abnormalities. Hallux limitus  B/L. Skin: No Porokeratosis. No infection or ulcers  Diagnosis:  Onychomycosis, , Pain in right toe, pain in left toes  Treatment & Plan Procedures and Treatment: Consent by patient was obtained for treatment procedures.   Debridement of mycotic and hypertrophic toenails, 1 through 5 bilateral and clearing of subungual debris. No ulceration, no infection noted..  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  Patient to see Liliane Channel. Return Visit-Office Procedure: Patient instructed to return to the office for a follow up visit 3 months for continued evaluation and treatment.    Gardiner Barefoot DPM

## 2019-05-20 ENCOUNTER — Other Ambulatory Visit: Payer: Self-pay

## 2019-05-20 ENCOUNTER — Ambulatory Visit: Payer: Medicare Other | Admitting: Orthotics

## 2019-05-20 DIAGNOSIS — M205X2 Other deformities of toe(s) (acquired), left foot: Secondary | ICD-10-CM

## 2019-05-20 DIAGNOSIS — E1142 Type 2 diabetes mellitus with diabetic polyneuropathy: Secondary | ICD-10-CM

## 2019-05-20 NOTE — Progress Notes (Signed)

## 2019-07-07 ENCOUNTER — Telehealth: Payer: Self-pay | Admitting: Podiatry

## 2019-07-07 NOTE — Telephone Encounter (Signed)
pts daughter left message yesterday checking on status of diabetic shoes.   I returned call and explained we have not received signed paperwork from Dr Sabra Heck and that is the hole up. I refaxed it by hand today and it has been faxed electronically 6 times. She is calling his office to see what is going on.

## 2019-07-30 ENCOUNTER — Ambulatory Visit: Payer: Medicare Other | Admitting: Podiatry

## 2019-08-06 ENCOUNTER — Ambulatory Visit (INDEPENDENT_AMBULATORY_CARE_PROVIDER_SITE_OTHER): Payer: Medicare Other | Admitting: Podiatry

## 2019-08-06 ENCOUNTER — Encounter: Payer: Self-pay | Admitting: Podiatry

## 2019-08-06 ENCOUNTER — Other Ambulatory Visit: Payer: Self-pay

## 2019-08-06 VITALS — Temp 98.3°F

## 2019-08-06 DIAGNOSIS — M79674 Pain in right toe(s): Secondary | ICD-10-CM | POA: Diagnosis not present

## 2019-08-06 DIAGNOSIS — M79675 Pain in left toe(s): Secondary | ICD-10-CM

## 2019-08-06 DIAGNOSIS — E1142 Type 2 diabetes mellitus with diabetic polyneuropathy: Secondary | ICD-10-CM | POA: Diagnosis not present

## 2019-08-06 DIAGNOSIS — B351 Tinea unguium: Secondary | ICD-10-CM

## 2019-08-06 DIAGNOSIS — M205X2 Other deformities of toe(s) (acquired), left foot: Secondary | ICD-10-CM

## 2019-08-06 DIAGNOSIS — M205X1 Other deformities of toe(s) (acquired), right foot: Secondary | ICD-10-CM

## 2019-08-06 NOTE — Progress Notes (Signed)
This patient returns to my office for at risk foot care.  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  This patient is unable to cut nails himself since the patient cannot reach his nails.These nails are painful walking and wearing shoes.  This patient presents for at risk foot care today.  General Appearance  Alert, conversant and in no acute stress.  Vascular  Dorsalis pedis and posterior tibial  pulses are palpable  bilaterally.  Capillary return is within normal limits  bilaterally. Temperature is within normal limits  bilaterally.  Neurologic  Senn-Weinstein monofilament wire test within normal limits  bilaterally. Muscle power within normal limits bilaterally.  Nails Thick disfigured discolored nails with subungual debris  from hallux to fifth toes bilaterally. No evidence of bacterial infection or drainage bilaterally.  Orthopedic  No limitations of motion  feet .  No crepitus or effusions noted.  No bony pathology or digital deformities noted.  Skin  normotropic skin with no porokeratosis noted bilaterally.  No signs of infections or ulcers noted.     Onychomycosis  Pain in right toes  Pain in left toes  Consent was obtained for treatment procedures.   Mechanical debridement of nails 1-5  bilaterally performed with a nail nipper.  Filed with dremel without incident.    Return office visit    3 months                  Told patient to return for periodic foot care and evaluation due to potential at risk complications.   Tahisha Hakim DPM  

## 2019-08-19 ENCOUNTER — Ambulatory Visit (INDEPENDENT_AMBULATORY_CARE_PROVIDER_SITE_OTHER): Payer: Medicare Other | Admitting: Orthotics

## 2019-08-19 ENCOUNTER — Other Ambulatory Visit: Payer: Self-pay

## 2019-08-19 DIAGNOSIS — M205X1 Other deformities of toe(s) (acquired), right foot: Secondary | ICD-10-CM | POA: Diagnosis not present

## 2019-08-19 DIAGNOSIS — M79675 Pain in left toe(s): Secondary | ICD-10-CM

## 2019-08-19 DIAGNOSIS — M205X2 Other deformities of toe(s) (acquired), left foot: Secondary | ICD-10-CM

## 2019-08-19 DIAGNOSIS — B351 Tinea unguium: Secondary | ICD-10-CM

## 2019-08-19 DIAGNOSIS — E1142 Type 2 diabetes mellitus with diabetic polyneuropathy: Secondary | ICD-10-CM | POA: Diagnosis not present

## 2019-08-19 NOTE — Progress Notes (Signed)

## 2019-11-09 ENCOUNTER — Ambulatory Visit: Payer: Medicare Other | Admitting: Podiatry

## 2019-11-12 ENCOUNTER — Other Ambulatory Visit: Payer: Self-pay

## 2019-11-12 ENCOUNTER — Encounter: Payer: Self-pay | Admitting: Podiatry

## 2019-11-12 ENCOUNTER — Ambulatory Visit (INDEPENDENT_AMBULATORY_CARE_PROVIDER_SITE_OTHER): Payer: Medicare Other | Admitting: Podiatry

## 2019-11-12 DIAGNOSIS — M79675 Pain in left toe(s): Secondary | ICD-10-CM

## 2019-11-12 DIAGNOSIS — M79674 Pain in right toe(s): Secondary | ICD-10-CM

## 2019-11-12 DIAGNOSIS — B351 Tinea unguium: Secondary | ICD-10-CM | POA: Diagnosis not present

## 2019-11-12 DIAGNOSIS — E1142 Type 2 diabetes mellitus with diabetic polyneuropathy: Secondary | ICD-10-CM | POA: Diagnosis not present

## 2019-11-12 NOTE — Progress Notes (Signed)
This patient returns to my office for at risk foot care.  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  This patient is unable to cut nails himself since the patient cannot reach his nails.These nails are painful walking and wearing shoes.  This patient presents for at risk foot care today.  General Appearance  Alert, conversant and in no acute stress.  Vascular  Dorsalis pedis and posterior tibial  pulses are palpable  bilaterally.  Capillary return is within normal limits  bilaterally. Temperature is within normal limits  bilaterally.  Neurologic  Senn-Weinstein monofilament wire test within normal limits  bilaterally. Muscle power within normal limits bilaterally.  Nails Thick disfigured discolored nails with subungual debris  from hallux to fifth toes bilaterally. No evidence of bacterial infection or drainage bilaterally.  Orthopedic  No limitations of motion  feet .  No crepitus or effusions noted.  No bony pathology or digital deformities noted.  Skin  normotropic skin with no porokeratosis noted bilaterally.  No signs of infections or ulcers noted.     Onychomycosis  Pain in right toes  Pain in left toes  Consent was obtained for treatment procedures.   Mechanical debridement of nails 1-5  bilaterally performed with a nail nipper.  Filed with dremel without incident.    Return office visit    3 months                  Told patient to return for periodic foot care and evaluation due to potential at risk complications.   Jennier Schissler DPM  

## 2020-02-18 ENCOUNTER — Other Ambulatory Visit: Payer: Self-pay

## 2020-02-18 ENCOUNTER — Ambulatory Visit (INDEPENDENT_AMBULATORY_CARE_PROVIDER_SITE_OTHER): Payer: Medicare Other | Admitting: Podiatry

## 2020-02-18 ENCOUNTER — Encounter: Payer: Self-pay | Admitting: Podiatry

## 2020-02-18 DIAGNOSIS — E1142 Type 2 diabetes mellitus with diabetic polyneuropathy: Secondary | ICD-10-CM | POA: Diagnosis not present

## 2020-02-18 DIAGNOSIS — B351 Tinea unguium: Secondary | ICD-10-CM | POA: Diagnosis not present

## 2020-02-18 DIAGNOSIS — M79674 Pain in right toe(s): Secondary | ICD-10-CM | POA: Diagnosis not present

## 2020-02-18 DIAGNOSIS — M79675 Pain in left toe(s): Secondary | ICD-10-CM

## 2020-02-18 NOTE — Progress Notes (Signed)
This patient returns to my office for at risk foot care.  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  This patient is unable to cut nails himself since the patient cannot reach his nails.These nails are painful walking and wearing shoes.  This patient presents for at risk foot care today.  General Appearance  Alert, conversant and in no acute stress.  Vascular  Dorsalis pedis and posterior tibial  pulses are palpable  bilaterally.  Capillary return is within normal limits  bilaterally. Temperature is within normal limits  bilaterally.  Neurologic  Senn-Weinstein monofilament wire test within normal limits  bilaterally. Muscle power within normal limits bilaterally.  Nails Thick disfigured discolored nails with subungual debris  from hallux to fifth toes bilaterally. No evidence of bacterial infection or drainage bilaterally.  Orthopedic  No limitations of motion  feet .  No crepitus or effusions noted.  No bony pathology or digital deformities noted.  Skin  normotropic skin with no porokeratosis noted bilaterally.  No signs of infections or ulcers noted.     Onychomycosis  Pain in right toes  Pain in left toes  Consent was obtained for treatment procedures.   Mechanical debridement of nails 1-5  bilaterally performed with a nail nipper.  Filed with dremel without incident.    Return office visit    3 months                  Told patient to return for periodic foot care and evaluation due to potential at risk complications.   Gardiner Barefoot DPM

## 2020-02-29 DIAGNOSIS — N1831 Chronic kidney disease, stage 3a: Secondary | ICD-10-CM | POA: Insufficient documentation

## 2020-03-28 ENCOUNTER — Encounter: Payer: Self-pay | Admitting: Urology

## 2020-03-28 ENCOUNTER — Other Ambulatory Visit: Payer: Self-pay

## 2020-03-28 ENCOUNTER — Ambulatory Visit (INDEPENDENT_AMBULATORY_CARE_PROVIDER_SITE_OTHER): Payer: Medicare Other | Admitting: Urology

## 2020-03-28 VITALS — BP 190/72 | HR 57 | Ht 70.0 in | Wt 220.0 lb

## 2020-03-28 DIAGNOSIS — R310 Gross hematuria: Secondary | ICD-10-CM | POA: Diagnosis not present

## 2020-03-28 LAB — URINALYSIS, COMPLETE
Bilirubin, UA: NEGATIVE
Ketones, UA: NEGATIVE
Leukocytes,UA: NEGATIVE
Nitrite, UA: NEGATIVE
Protein,UA: NEGATIVE
Specific Gravity, UA: 1.015 (ref 1.005–1.030)
Urobilinogen, Ur: 0.2 mg/dL (ref 0.2–1.0)
pH, UA: 7 (ref 5.0–7.5)

## 2020-03-28 LAB — MICROSCOPIC EXAMINATION
Bacteria, UA: NONE SEEN
RBC, Urine: >30 /hpf — AB (ref 0–2)

## 2020-03-28 NOTE — Progress Notes (Signed)
03/28/2020 10:52 AM   Virgina Norfolk 1932/07/24 [id number]  Referring provider: Rusty Aus, MD [place] [facility] [place],  [place] [address]  Chief Complaint  Patient presents with  . Hematuria    HPI: Timothy Duke is a 86 y.o. male seen at request of Dr. Sabra Heck for evaluation of gross hematuria.   Onset total gross painless hematuria 03/16/2020  Lab visit [facility] 03/17/2020 remarkable for grossly bloody urine with >50 RBC and no WBCs  Empirically treated with Cipro  Urine culture was negative  Had persistent hematuria for several days which subsequently resolved  No anticoagulant/antiplatelet medication  No bothersome LUTS  No flank, abdominal or pelvic pain  No prior history urologic problems  Enterococcus bacteriuria April 2021   PMH: Past Medical History:  Diagnosis Date  . Diabetes mellitus without complication [facility])     Surgical History: History reviewed. No pertinent surgical history.  Home Medications:  Allergies as of 03/28/2020   No Known Allergies     Medication List       Accurate as of March 28, 2020 10:52 AM. If you have any questions, ask your nurse or doctor.        Acetaminophen 500 MG capsule Take by mouth.   amLODipine 10 MG tablet Commonly known as: NORVASC Take by mouth.   aspirin EC 81 MG tablet Take by mouth.   empagliflozin 10 MG Tabs tablet Commonly known as: JARDIANCE Take by mouth.   glimepiride 4 MG tablet Commonly known as: AMARYL TAKE 1 TABLET BY MOUTH IN THE MORNING FOR  SUGAR   glipiZIDE 10 MG 24 hr tablet Commonly known as: GLUCOTROL XL Take by mouth.   hydrALAZINE 50 MG tablet Commonly known as: APRESOLINE TAKE ONE TABLET BY MOUTH TWICE DAILY   hydrochlorothiazide 25 MG tablet Commonly known as: HYDRODIURIL Take by mouth.   latanoprost 0.005 % ophthalmic solution Commonly known as: XALATAN Apply to eye.   lisinopril-hydrochlorothiazide  20-12.5 MG tablet Commonly known as: ZESTORETIC TAKE ONE TABLET BY MOUTH ONCE DAILY   meloxicam 7.5 MG tablet Commonly known as: MOBIC Take by mouth.   metFORMIN 1000 MG tablet Commonly known as: GLUCOPHAGE TAKE ONE TABLET BY MOUTH TWICE DAILY WITH MEALS   Multi-Vitamins Tabs Take by mouth.   Ozempic (0.25 or 0.5 MG/DOSE) 2 MG/1.5ML Sopn Generic drug: Semaglutide(0.25 or 0.5MG/DOS) Inject 0.5 mg into the skin once a week.   glucose blood test strip USE AS DIRECTED TWICE DAILY   Prodigy No Coding Blood Gluc test strip Generic drug: glucose blood 2 (two) times daily. as directed   Prodigy Twist Top Lancets 28G Misc USE TO CHECK BLOOD SUGAR TWICE DAILY AS INSTRUCTED   sitaGLIPtin 100 MG tablet Commonly known as: JANUVIA Take by mouth.   timolol 0.5 % ophthalmic solution Commonly known as: TIMOPTIC Apply to eye.       Allergies: No Known Allergies  Family History: History reviewed. No pertinent family history.  Social History:  reports that he has never smoked. His smokeless tobacco use includes chew. He reports that he does not drink alcohol and does not use drugs.   Physical Exam: BP (!) 210/90   Pulse (!) 59   Ht 5' 10" (1.778 m)   Wt 220 lb (99.8 kg)   BMI 31.57 kg/m   Constitutional:  Alert, No acute distress. HEENT: Remy AT, moist mucus membranes.  Trachea midline, no masses. Cardiovascular: No clubbing, cyanosis, or edema. Respiratory: Normal respiratory effort, no  increased work of breathing. GI: Abdomen is soft, nontender, nondistended, no abdominal masses GU: Phallus uncircumcised without lesions.  Testes descended bilaterally without masses or tenderness, spermatic cord/epididymis palpably normal bilaterally Skin: No rashes, bruises or suspicious lesions. Neurologic: Grossly intact, no focal deficits, moving all 4 extremities. Psychiatric: Normal mood and affect.  Laboratory Data:  Urinalysis Urine grossly clear; dipstick 3+ blood/2+ glucose;  microscopy >30 RBC, 6-10 WBC  Assessment & Plan:    1. Gross hematuria  AUA risk stratification: High  We discussed potential causes of gross hematuria including both benign etiologies and urologic malignancies  We discussed the standard recommend evaluation for high risk hematuria to include CT urogram and cystoscopy  He does have diabetic nephropathy and GFR 48; will schedule MRI abdomen/pelvis with and without contrast and lieu of CT urogram  All questions were answered and he desires to proceed with further evaluation  2.  Hypertension  BP was repeated and systolic was 229.  He does have a blood pressure kit at home and will check his BP earlier this afternoon and if it remains elevated he was instructed to contact Dr. Ammie Ferrier office    Abbie Sons, [facility] [address], [place], [place] [address] [phone]

## 2020-04-06 ENCOUNTER — Ambulatory Visit (INDEPENDENT_AMBULATORY_CARE_PROVIDER_SITE_OTHER): Payer: Medicare Other | Admitting: Urology

## 2020-04-06 ENCOUNTER — Other Ambulatory Visit: Payer: Self-pay | Admitting: Urology

## 2020-04-06 ENCOUNTER — Encounter: Payer: Self-pay | Admitting: Urology

## 2020-04-06 ENCOUNTER — Other Ambulatory Visit: Payer: Self-pay

## 2020-04-06 VITALS — BP 138/74 | Ht 72.0 in | Wt 210.0 lb

## 2020-04-06 DIAGNOSIS — R310 Gross hematuria: Secondary | ICD-10-CM | POA: Diagnosis not present

## 2020-04-06 LAB — URINALYSIS, COMPLETE
Bilirubin, UA: NEGATIVE
Ketones, UA: NEGATIVE
Leukocytes,UA: NEGATIVE
Nitrite, UA: NEGATIVE
Protein,UA: NEGATIVE
Specific Gravity, UA: 1.015 (ref 1.005–1.030)
Urobilinogen, Ur: 0.2 mg/dL (ref 0.2–1.0)
pH, UA: 7 (ref 5.0–7.5)

## 2020-04-06 LAB — MICROSCOPIC EXAMINATION: Bacteria, UA: NONE SEEN

## 2020-04-06 NOTE — Progress Notes (Signed)
   04/06/20  CC:  Chief Complaint  Patient presents with  . Cysto    HPI: Initially seen 12/13 with intermittent gross hematuria.  Last episode was 3 days ago.  Scheduled for MRI abdomen/pelvis with without contrast due to decreased GFR.  This is scheduled for tomorrow  Blood pressure 138/74, height 6' (1.829 m), weight 210 lb (95.3 kg). NED. A&Ox3.   No respiratory distress   Abd soft, NT, ND Normal phallus with bilateral descended testicles  Cystoscopy Procedure Note  Patient identification was confirmed, informed consent was obtained, and patient was prepped using Betadine solution.  Lidocaine jelly was administered per urethral meatus.     Pre-Procedure: - Inspection reveals a normal caliber urethral meatus.  Procedure: The flexible cystoscope was introduced without difficulty - No urethral strictures/lesions are present. - Prominent lateral lobe enlargement with marked hypervascularity prostate  - Moderate elevation bladder neck - Bilateral ureteral orifices identified - Bladder mucosa  reveals no ulcers, tumors, or lesions - No bladder stones -Mild trabeculation  Retroflexion shows no intravesical median lobe or tumor   Post-Procedure: - Patient tolerated the procedure well  Assessment/ Plan:  Marked BPH with hypervascularity which could be a source of his hematuria  No bladder mucosal abnormalities  Urine cytology sent  Upper tract imaging schedule tomorrow and will call with results   Abbie Sons, MD

## 2020-04-07 ENCOUNTER — Other Ambulatory Visit: Payer: Self-pay

## 2020-04-07 ENCOUNTER — Ambulatory Visit
Admission: RE | Admit: 2020-04-07 | Discharge: 2020-04-07 | Disposition: A | Discharge status: Home / Self Care | Payer: Medicare Other | Source: Ambulatory Visit | Attending: Urology | Admitting: Urology

## 2020-04-07 DIAGNOSIS — R310 Gross hematuria: Secondary | ICD-10-CM | POA: Insufficient documentation

## 2020-04-07 LAB — CYTOLOGY - NON PAP

## 2020-04-07 IMAGING — MR MR PELVIS WO/W CM
17 of 21 series · 38 of 48 positions shown · IV contrast (gadavist)
Comparison: None.

CLINICAL DATA: Gross hematuria

EXAM:
MRI ABDOMEN AND PELVIS WITHOUT AND WITH CONTRAST
TECHNIQUE: Multiplanar multisequence MR imaging of the abdomen and pelvis was
performed both before and after the administration of intravenous
contrast.
CONTRAST:  9mL GADAVIST GADOBUTROL 1 MMOL/ML IV SOLN

[Series 5: cor haste mbh · coronal · 4.0mm · 1.25mm/px · 1 of 30 slices shown]
[im 1/30]
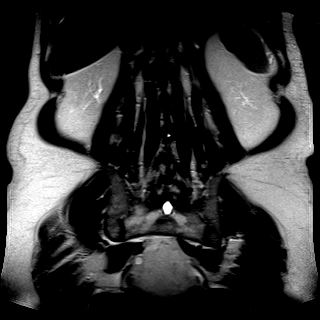

[Series 8: ax haste kidney · axial · 4.0mm · 1.45mm/px · 1 of 36 slices shown]
[im 1/36]
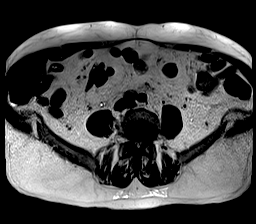

[Series 9: ax in & · axial · 6.0mm · 0.68mm/px · 1 of 60 slices shown]
[im 1/60]
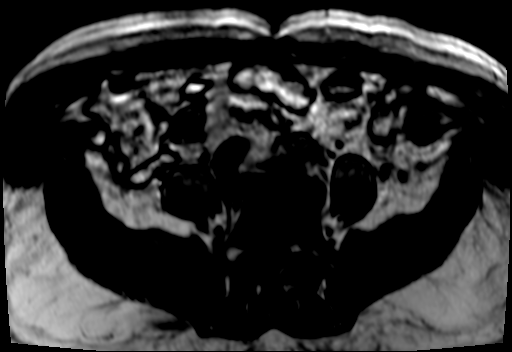

[Series 14: T1 dynamic fat-sat · axial · non-contrast · 3.0mm · 0.66mm/px · z∈[-173,+64]mm · 2 of 80 slices shown (1 of 5)]
[im 1/80]
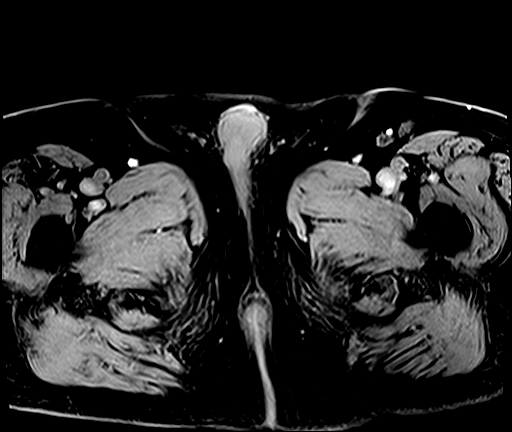
[im 80/80]
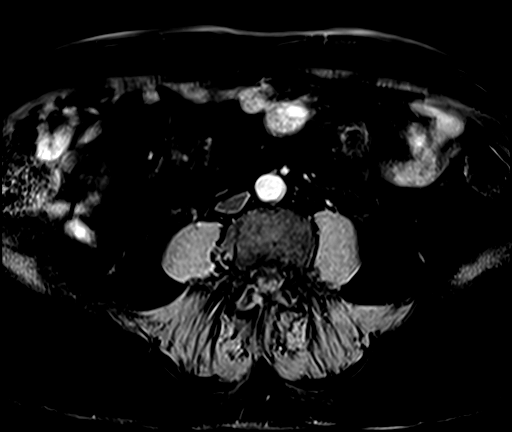

[Series 17: T2 fat-sat · axial · 6.0mm · 1.19mm/px · 1 of 46 slices shown (1 of 3)]
[im 1/46]
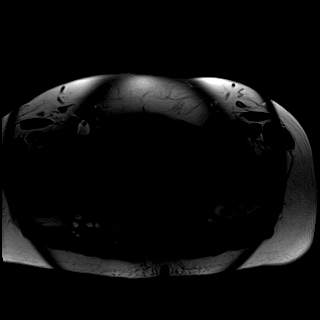

[Series 17: T2 fat-sat · axial · 6.0mm · 1.19mm/px · 1 of 46 slices shown (2 of 3)]
[im 1/46]
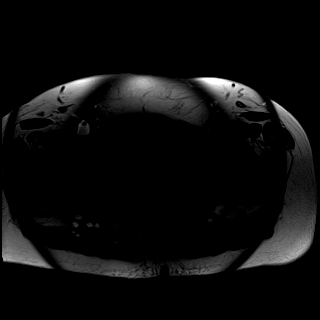

[Series 17: T2 fat-sat · axial · 6.0mm · 1.19mm/px · z∈[-105,+219]mm · 2 of 46 slices shown (3 of 3)]
[im 1/46]
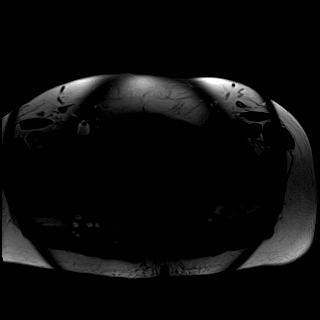
[im 46/46]
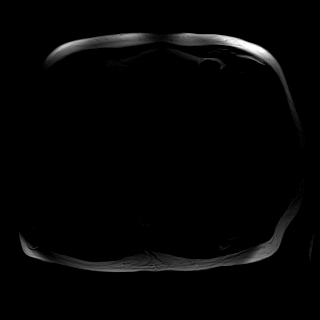

[Series 21: T1 dynamic fat-sat · axial · non-contrast · 3.0mm · 1.19mm/px · z∈[+28,+241]mm · 3 of 72 slices shown (2 of 5)]
[im 1/72]
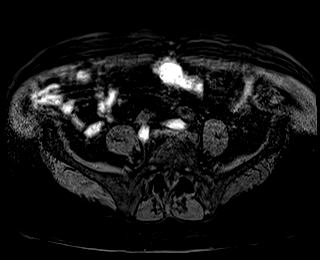
[im 36/72]
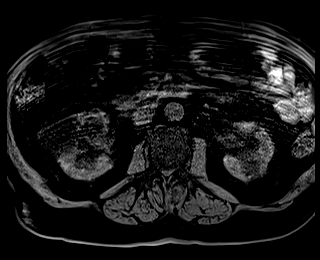
[im 72/72]
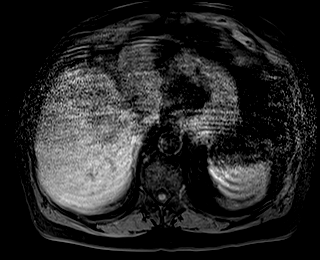

[Series 22: T1 dynamic fat-sat post-contrast · axial · 3.0mm · 1.19mm/px · z∈[+28,+241]mm · 3 of 72 slices shown (1 of 3)]
[im 1/72]
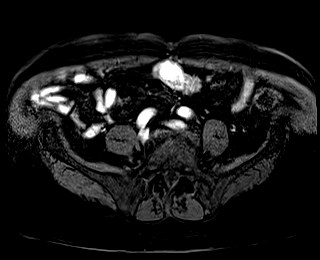
[im 36/72]
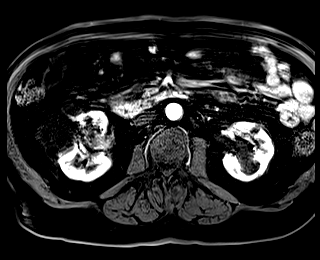
[im 72/72]
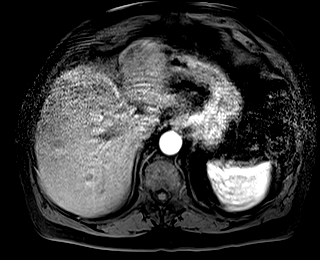

[Series 23: T1 dynamic fat-sat · axial · 3.0mm · 1.19mm/px · z∈[+28,+241]mm · 3 of 72 slices shown (3 of 5)]
[im 1/72]
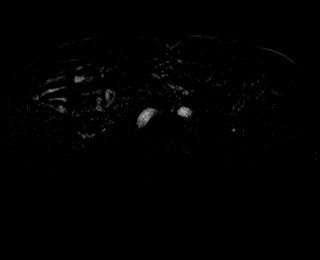
[im 36/72]
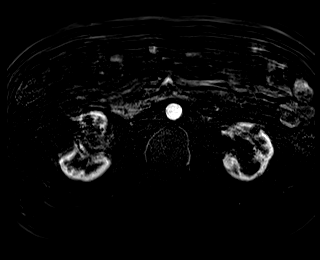
[im 72/72]
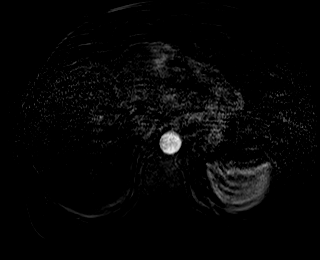

[Series 25: T1 dynamic post-contrast · axial · 3.0mm · 0.66mm/px · z∈[-173,+64]mm · 3 of 80 slices shown (1 of 3)]
[im 1/80]
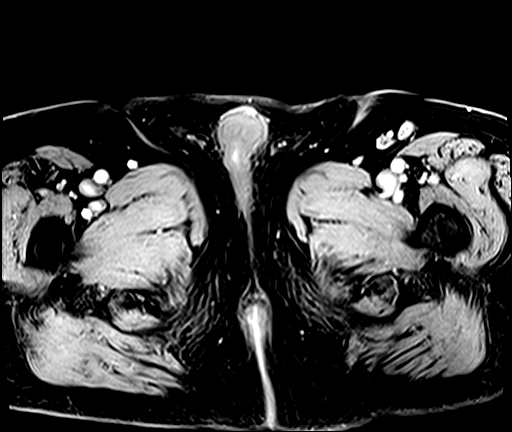
[im 40/80]
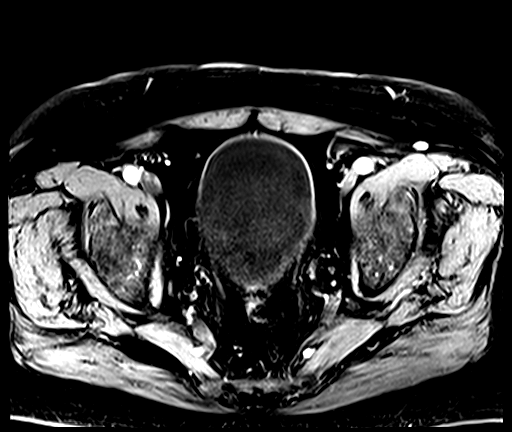
[im 80/80]
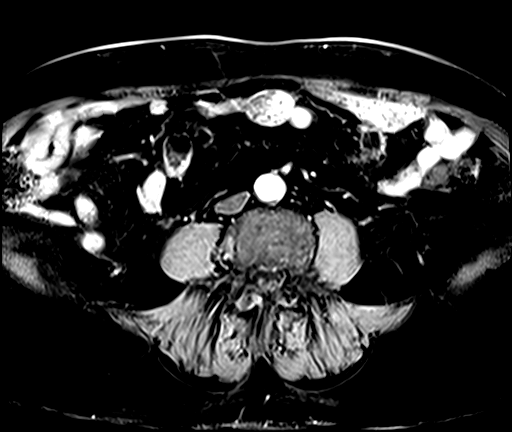

[Series 26: T1 dynamic fat-sat post-contrast · axial · 3.0mm · 1.19mm/px · z∈[+28,+241]mm · 3 of 72 slices shown (2 of 3)]
[im 1/72]
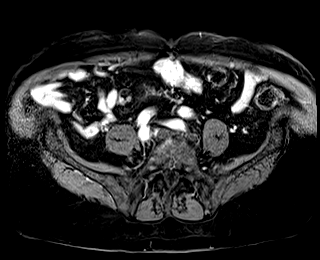
[im 36/72]
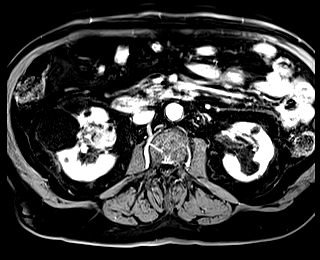
[im 72/72]
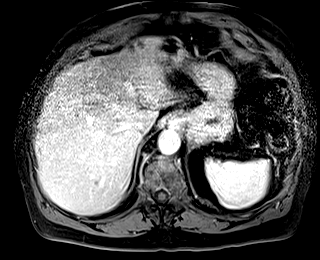

[Series 27: T1 dynamic fat-sat · axial · 3.0mm · 1.19mm/px · z∈[+28,+241]mm · 3 of 72 slices shown (4 of 5)]
[im 1/72]
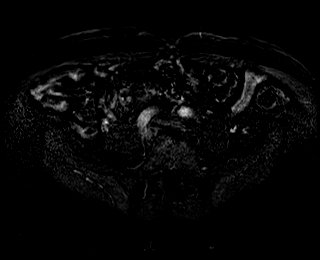
[im 36/72]
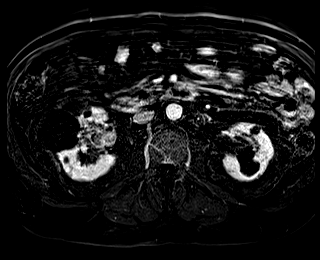
[im 72/72]
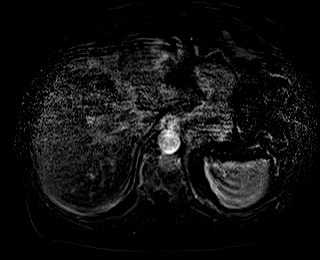

[Series 28: T1 dynamic fat-sat post-contrast · axial · 3.0mm · 1.19mm/px · z∈[+28,+241]mm · 3 of 72 slices shown (3 of 3)]
[im 1/72]
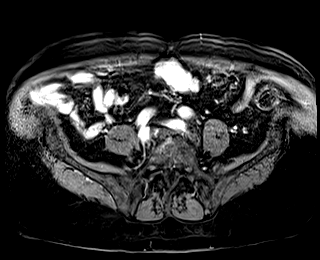
[im 36/72]
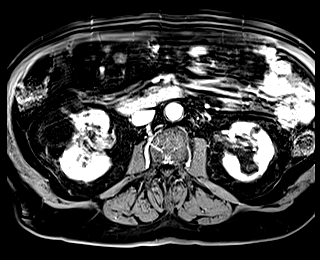
[im 72/72]
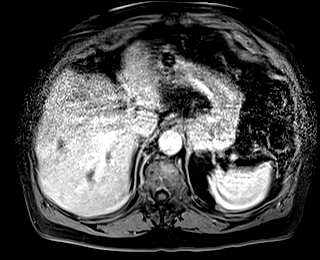

[Series 29: T1 dynamic fat-sat · axial · 3.0mm · 1.19mm/px · z∈[+28,+241]mm · 3 of 72 slices shown (5 of 5)]
[im 1/72]
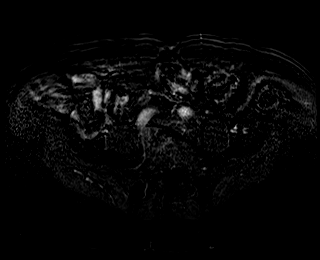
[im 36/72]
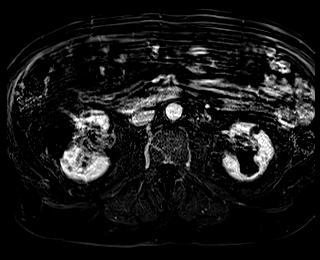
[im 72/72]
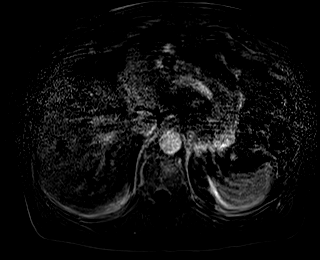

[Series 31: T1 dynamic post-contrast · axial · 3.0mm · 0.66mm/px · z∈[-173,+64]mm · 3 of 80 slices shown (2 of 3)]
[im 1/80]
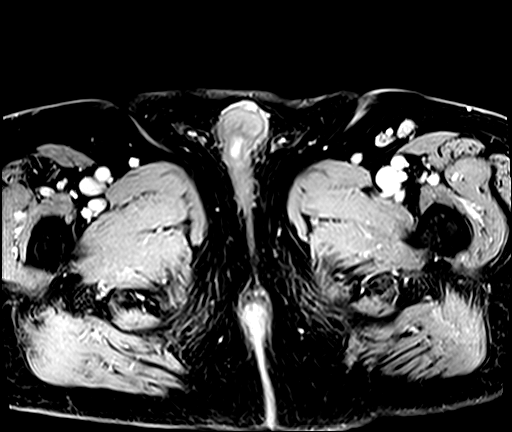
[im 40/80]
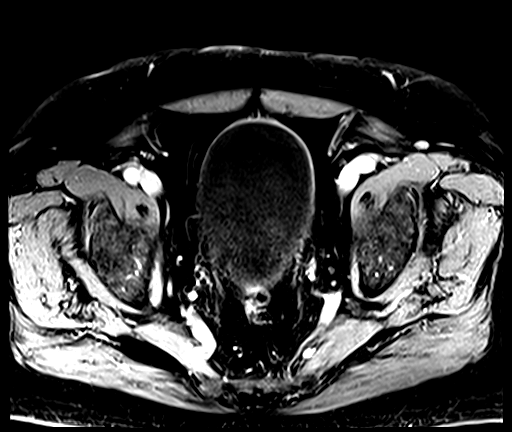
[im 80/80]
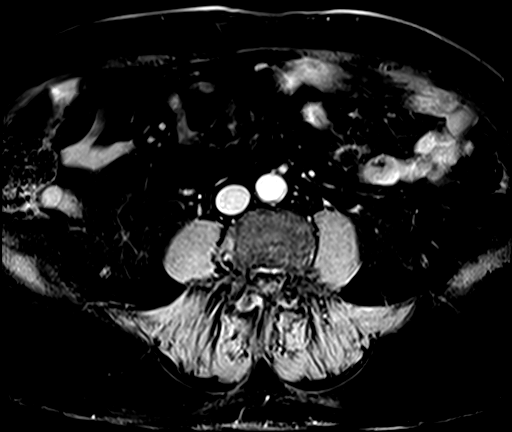

[Series 32: T1 dynamic post-contrast · coronal · 3.0mm · 1.31mm/px · 2 of 72 slices shown (3 of 3)]
[im 1/72]
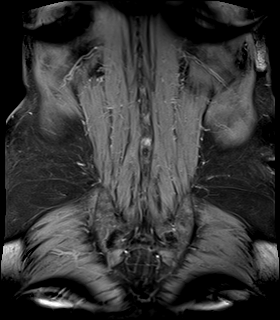
[im 36/72]
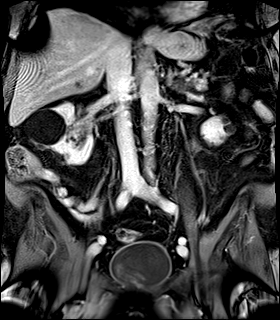

[38 of 48 positions shown; findings below may reference images not displayed]

FINDINGS: Motion degraded images.

Lower chest: Not visualized.

Hepatobiliary: Incompletely visualized. Scattered hepatic cysts.
cm lobulated T2 hyperintense lesion inferiorly in the right hepatic
lobe (series 8/image 11) favors a benign hemangioma.

Layering small gallstones (series 8/image 10), without associated
inflammatory changes. No intrahepatic or extrahepatic ductal
dilatation.

Pancreas: Numerous scattered small subcentimeter T2 hyperintense
pancreatic lesions, including clustered lesions in the pancreatic
neck (series 8/image 9), favoring side branch IPMNs. No pancreatic
ductal dilatation or parenchymal atrophy. No peripancreatic
inflammatory changes.

Spleen:  Incompletely visualized bowel within normal limits.

Adrenals/Urinary Tract:  Adrenal glands are within normal limits.

Simple bilateral renal cysts, measuring up to 4.6 cm in the lateral
right upper kidney (series 8/image 14), benign (Bosniak I).
Nonenhancing cyst with irregular margins, including a 3.7 cm cyst in
the posterior left lower kidney (series 8/image 21) and a 5.3 cm
cyst in the lateral right lower kidney (series 8/image 20), benign
(Bosniak II). Complex/hemorrhagic cysts with intrinsic T1
hyperintensity but without enhancement, measuring 12 mm in the left
upper kidney (series 8/image 7) and 17 mm in the right lower kidney
(series 8/image 27), benign (Bosniak II).

No enhancing renal lesions, noting that small lesions could be
obscured due to motion degradation. No hydronephrosis.

Bladder is within normal limits.

Stomach/Bowel: Stomach is incompletely visualized but grossly
unremarkable.

Visualized bowel is grossly unremarkable.

Vascular/Lymphatic:  No evidence of abdominal aortic aneurysm.

No suspicious abdominopelvic lymphadenopathy.

Reproductive: Prostatomegaly, with enlargement of the central gland,
suggesting BPH.

Other:  No abdominopelvic ascites.

Postsurgical changes along the midline anterior abdominal wall.

Musculoskeletal: No focal osseous lesions.
IMPRESSION: Limited evaluation due to motion degradation.

Multiple bilateral renal cysts, some of which are mildly irregular
and or hemorrhagic, benign (Bosniak I-II). No enhancing renal
lesions.

Prostatomegaly, suggesting BPH.

Cholelithiasis, without associated inflammatory changes.

Numerous subcentimeter pancreatic cysts, likely reflecting side
branch IPMNs. No associated main pancreatic ductal dilatation. Given
the patient's age, it is unclear that follow-up imaging is
warranted.

## 2020-04-07 IMAGING — MR MR ABDOMEN WO/W CM
16 of 20 series · 38 of 48 positions shown · IV contrast (9ml Gadavist)
Comparison: None.

CLINICAL DATA: Gross hematuria

EXAM:
MRI ABDOMEN AND PELVIS WITHOUT AND WITH CONTRAST
TECHNIQUE: Multiplanar multisequence MR imaging of the abdomen and pelvis was
performed both before and after the administration of intravenous
contrast.
CONTRAST:  9mL GADAVIST GADOBUTROL 1 MMOL/ML IV SOLN

[Series 5: cor haste mbh · coronal · 4.0mm · 1.25mm/px · 1 of 30 slices shown]
[im 1/30]
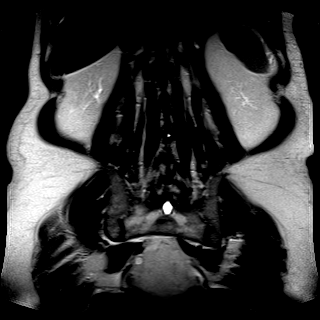

[Series 8: ax haste kidney · axial · 4.0mm · 1.45mm/px · 1 of 36 slices shown]
[im 1/36]
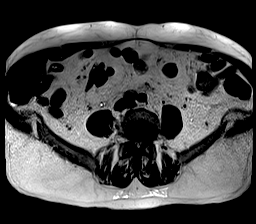

[Series 9: ax in & · axial · 6.0mm · 0.68mm/px · 1 of 60 slices shown]
[im 1/60]
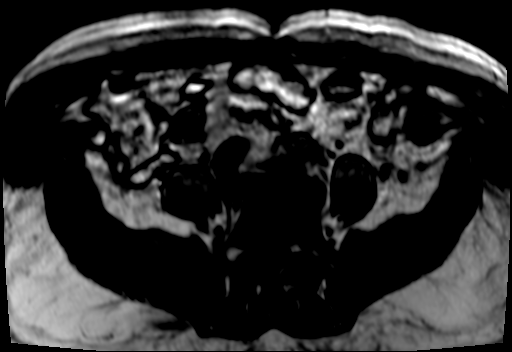

[Series 14: T1 dynamic fat-sat · axial · non-contrast · 3.0mm · 0.66mm/px · z∈[-173,+64]mm · 2 of 80 slices shown (1 of 5)]
[im 1/80]
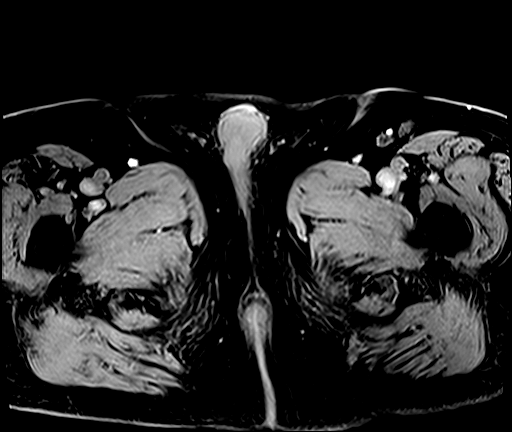
[im 80/80]
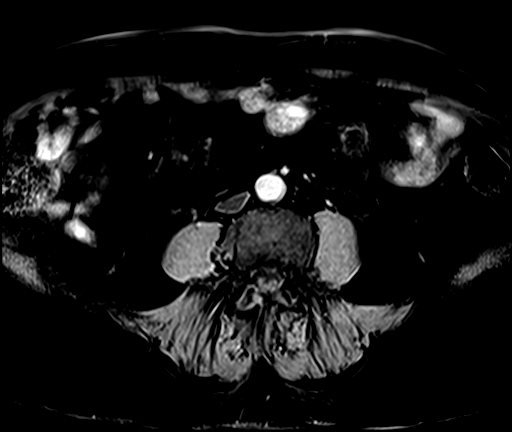

[Series 17: T2 fat-sat · axial · 6.0mm · 1.19mm/px · z∈[-105,+219]mm · 2 of 46 slices shown (1 of 2)]
[im 1/46]
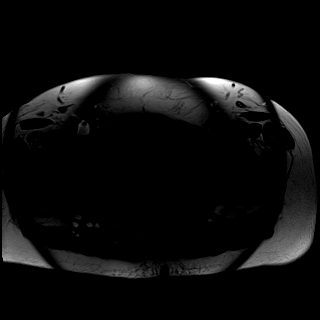
[im 46/46]
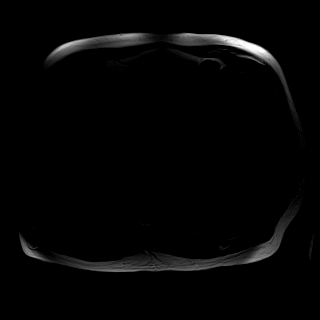

[Series 17: T2 fat-sat · axial · 6.0mm · 1.19mm/px · z∈[-105,+219]mm · 2 of 46 slices shown (2 of 2)]
[im 1/46]
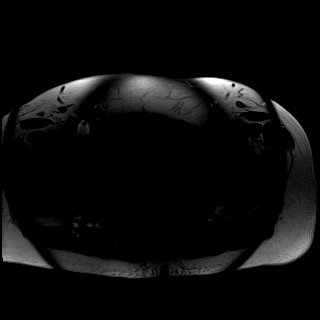
[im 46/46]
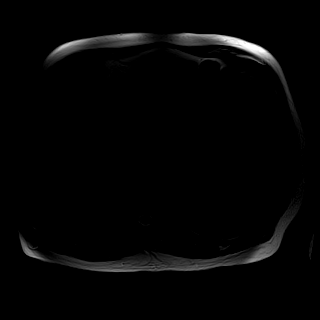

[Series 21: T1 dynamic fat-sat · axial · non-contrast · 3.0mm · 1.19mm/px · z∈[+28,+241]mm · 3 of 72 slices shown (2 of 5)]
[im 1/72]
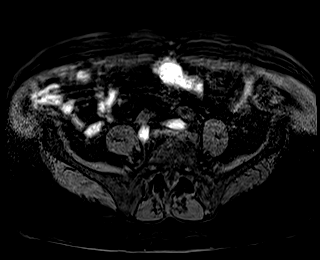
[im 36/72]
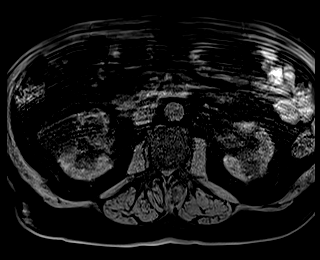
[im 72/72]
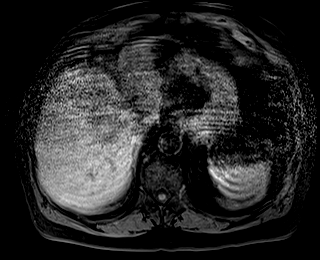

[Series 22: T1 dynamic fat-sat post-contrast · axial · 3.0mm · 1.19mm/px · z∈[+28,+241]mm · 3 of 72 slices shown (1 of 3)]
[im 1/72]
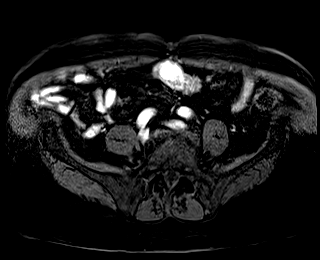
[im 36/72]
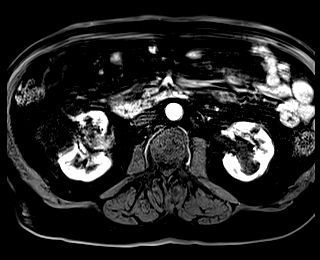
[im 72/72]
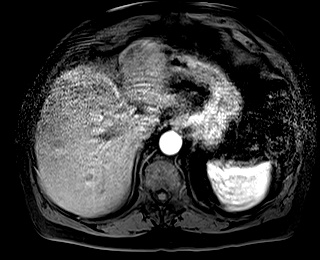

[Series 23: T1 dynamic fat-sat · axial · 3.0mm · 1.19mm/px · z∈[+28,+241]mm · 3 of 72 slices shown (3 of 5)]
[im 1/72]
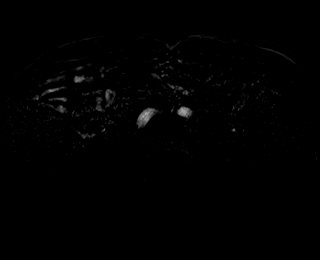
[im 36/72]
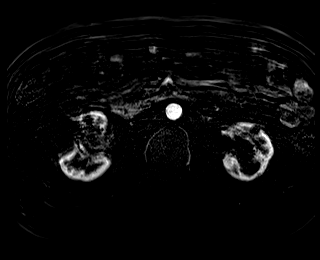
[im 72/72]
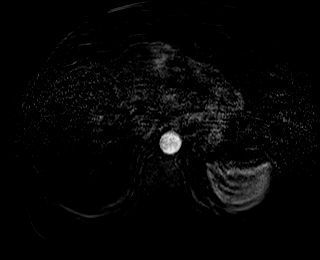

[Series 25: T1 dynamic post-contrast · axial · 3.0mm · 0.66mm/px · z∈[-173,+64]mm · 3 of 80 slices shown (1 of 3)]
[im 1/80]
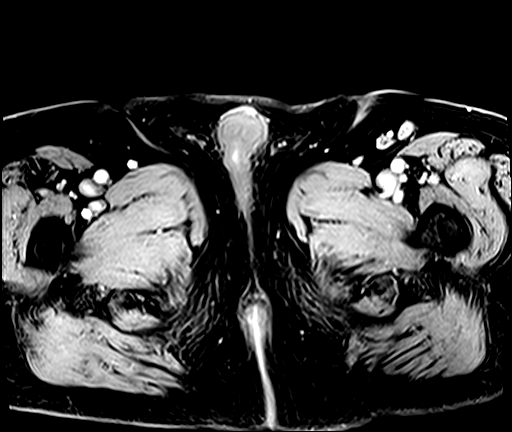
[im 40/80]
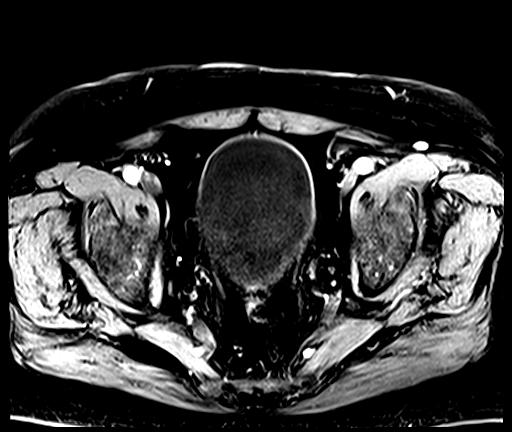
[im 80/80]
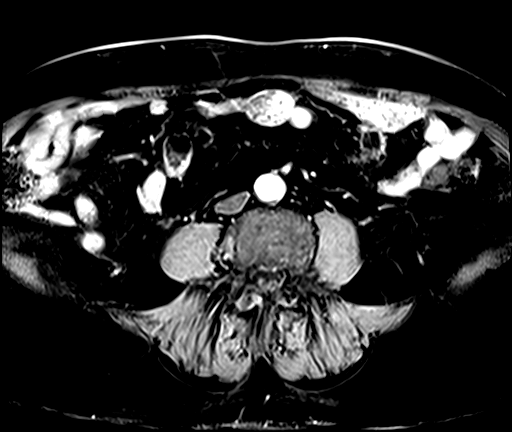

[Series 26: T1 dynamic fat-sat post-contrast · axial · 3.0mm · 1.19mm/px · z∈[+28,+241]mm · 3 of 72 slices shown (2 of 3)]
[im 1/72]
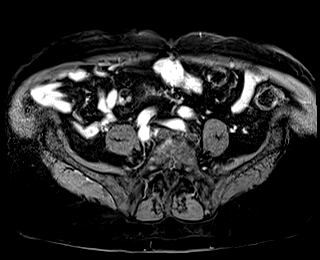
[im 36/72]
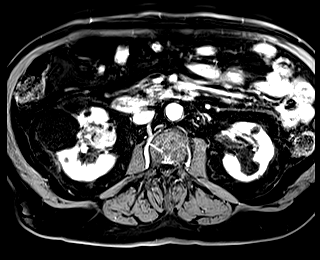
[im 72/72]
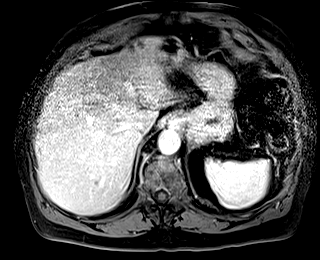

[Series 27: T1 dynamic fat-sat · axial · 3.0mm · 1.19mm/px · z∈[+28,+241]mm · 3 of 72 slices shown (4 of 5)]
[im 1/72]
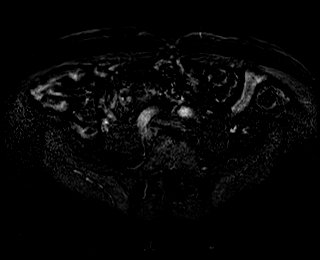
[im 36/72]
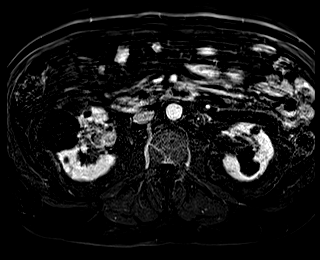
[im 72/72]
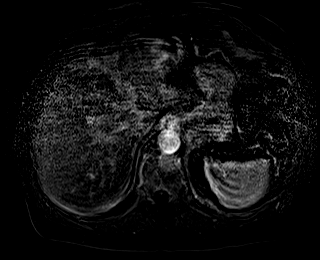

[Series 28: T1 dynamic fat-sat post-contrast · axial · 3.0mm · 1.19mm/px · z∈[+28,+241]mm · 3 of 72 slices shown (3 of 3)]
[im 1/72]
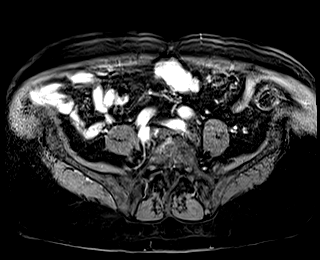
[im 36/72]
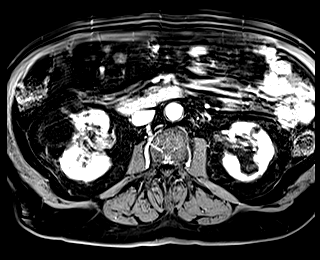
[im 72/72]
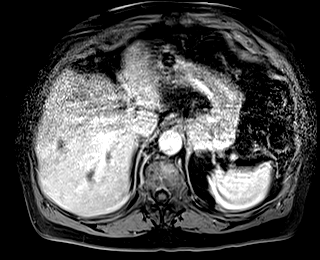

[Series 29: T1 dynamic fat-sat · axial · 3.0mm · 1.19mm/px · z∈[+28,+241]mm · 3 of 72 slices shown (5 of 5)]
[im 1/72]
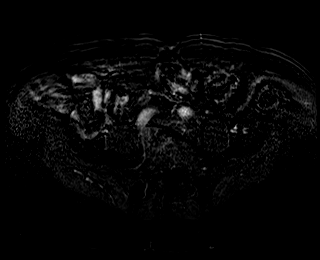
[im 36/72]
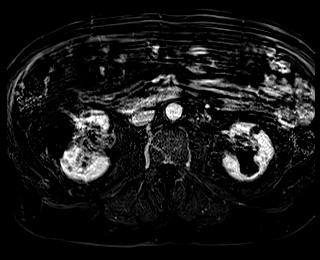
[im 72/72]
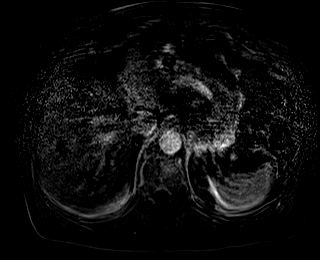

[Series 31: T1 dynamic post-contrast · axial · 3.0mm · 0.66mm/px · z∈[-173,+64]mm · 3 of 80 slices shown (2 of 3)]
[im 1/80]
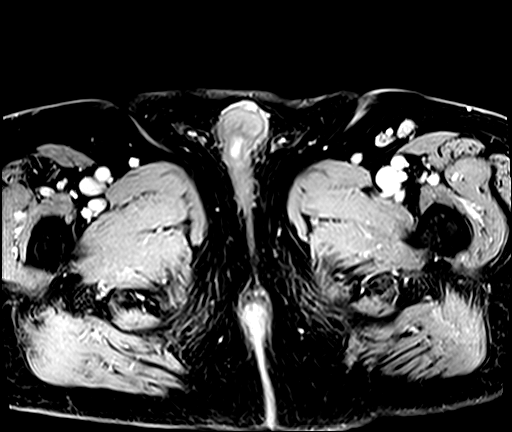
[im 40/80]
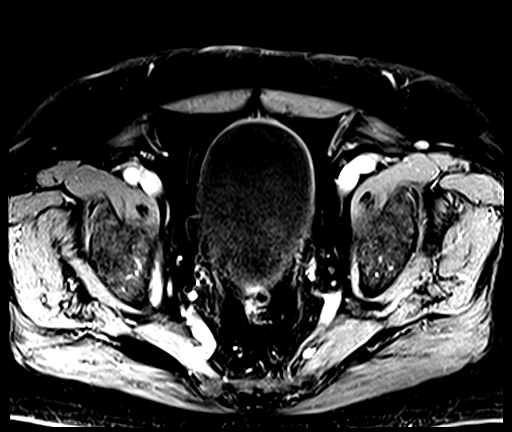
[im 80/80]
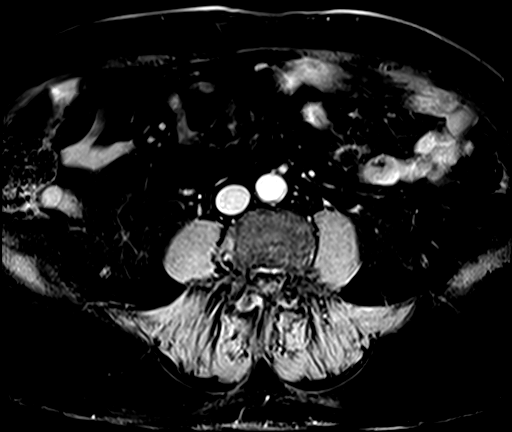

[Series 32: T1 dynamic post-contrast · coronal · 3.0mm · 1.31mm/px · 2 of 72 slices shown (3 of 3)]
[im 1/72]
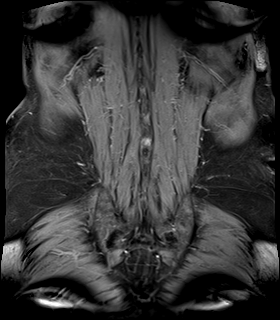
[im 36/72]
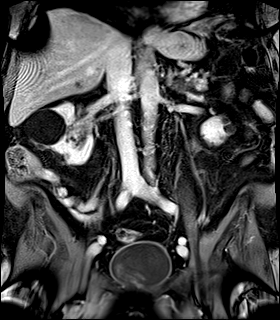

[38 of 48 positions shown; findings below may reference images not displayed]

FINDINGS: Motion degraded images.

Lower chest: Not visualized.

Hepatobiliary: Incompletely visualized. Scattered hepatic cysts.
cm lobulated T2 hyperintense lesion inferiorly in the right hepatic
lobe (series 8/image 11) favors a benign hemangioma.

Layering small gallstones (series 8/image 10), without associated
inflammatory changes. No intrahepatic or extrahepatic ductal
dilatation.

Pancreas: Numerous scattered small subcentimeter T2 hyperintense
pancreatic lesions, including clustered lesions in the pancreatic
neck (series 8/image 9), favoring side branch IPMNs. No pancreatic
ductal dilatation or parenchymal atrophy. No peripancreatic
inflammatory changes.

Spleen:  Incompletely visualized bowel within normal limits.

Adrenals/Urinary Tract:  Adrenal glands are within normal limits.

Simple bilateral renal cysts, measuring up to 4.6 cm in the lateral
right upper kidney (series 8/image 14), benign (Bosniak I).
Nonenhancing cyst with irregular margins, including a 3.7 cm cyst in
the posterior left lower kidney (series 8/image 21) and a 5.3 cm
cyst in the lateral right lower kidney (series 8/image 20), benign
(Bosniak II). Complex/hemorrhagic cysts with intrinsic T1
hyperintensity but without enhancement, measuring 12 mm in the left
upper kidney (series 8/image 7) and 17 mm in the right lower kidney
(series 8/image 27), benign (Bosniak II).

No enhancing renal lesions, noting that small lesions could be
obscured due to motion degradation. No hydronephrosis.

Bladder is within normal limits.

Stomach/Bowel: Stomach is incompletely visualized but grossly
unremarkable.

Visualized bowel is grossly unremarkable.

Vascular/Lymphatic:  No evidence of abdominal aortic aneurysm.

No suspicious abdominopelvic lymphadenopathy.

Reproductive: Prostatomegaly, with enlargement of the central gland,
suggesting BPH.

Other:  No abdominopelvic ascites.

Postsurgical changes along the midline anterior abdominal wall.

Musculoskeletal: No focal osseous lesions.
IMPRESSION: Limited evaluation due to motion degradation.

Multiple bilateral renal cysts, some of which are mildly irregular
and or hemorrhagic, benign (Bosniak I-II). No enhancing renal
lesions.

Prostatomegaly, suggesting BPH.

Cholelithiasis, without associated inflammatory changes.

Numerous subcentimeter pancreatic cysts, likely reflecting side
branch IPMNs. No associated main pancreatic ductal dilatation. Given
the patient's age, it is unclear that follow-up imaging is
warranted.

## 2020-04-07 MED ORDER — GADOBUTROL 1 MMOL/ML IV SOLN
9.0000 mL | Freq: Once | INTRAVENOUS | Status: AC | PRN
  Administered 2020-04-07: 9 mL via INTRAVENOUS

## 2020-04-12 ENCOUNTER — Telehealth: Payer: Self-pay | Admitting: *Deleted

## 2020-04-12 NOTE — Telephone Encounter (Signed)
The hematuria is most likely from prostate enlargement.  If it is recurring would start finasteride 5 mg daily which will help to shrink prostate blood vessels.

## 2020-04-12 NOTE — Telephone Encounter (Signed)
-----   Message from Riki Altes, MD sent at 04/11/2020  5:30 PM EST ----- MRI showed cyst in both kidneys which have a benign appearance.  He was also noted to have stones in the gallbladder.  Recommend a follow-up visit with Carollee Herter or Sam in 6 months with repeat urinalysis

## 2020-04-12 NOTE — Telephone Encounter (Signed)
Patient's daughter, Timothy Duke returned call. Informed as directed, voiced understanding.  Concerned about ongoing blood in urine. Aware if worsens to contact office. Patient's daughter would like to know if any further work up is necessary. Please advise.

## 2020-04-13 MED ORDER — FINASTERIDE 5 MG PO TABS: 5.0000 mg | ORAL_TABLET | Freq: Every day | ORAL | 0 refills | Status: DC

## 2020-04-13 NOTE — Telephone Encounter (Signed)
Notified patient daughter  as instructed,  Sent rx in .

## 2020-05-16 ENCOUNTER — Telehealth: Payer: Self-pay | Admitting: Family Medicine

## 2020-05-16 NOTE — Telephone Encounter (Signed)
Patient's daughter Kieth Brightly called and informed me that patient is still having some bleeding when he uses the bathroom about 1 time a week. Per Stoioff's last note in December he is to start Finasteride to help shrink the prostate and blood vessels. I informed her that it will take about 6 months for the Finasteride to work on shrinking the prostates. Patient has no complaints to dysuria. He is to call our office if the hematuria gets worse or if he is having difficulty urinating.

## 2020-05-19 ENCOUNTER — Other Ambulatory Visit: Payer: Self-pay

## 2020-05-19 ENCOUNTER — Ambulatory Visit (INDEPENDENT_AMBULATORY_CARE_PROVIDER_SITE_OTHER): Payer: Medicare Other | Admitting: Podiatry

## 2020-05-19 ENCOUNTER — Encounter: Payer: Self-pay | Admitting: Podiatry

## 2020-05-19 DIAGNOSIS — M205X2 Other deformities of toe(s) (acquired), left foot: Secondary | ICD-10-CM | POA: Diagnosis not present

## 2020-05-19 DIAGNOSIS — E1142 Type 2 diabetes mellitus with diabetic polyneuropathy: Secondary | ICD-10-CM

## 2020-05-19 DIAGNOSIS — M79674 Pain in right toe(s): Secondary | ICD-10-CM

## 2020-05-19 DIAGNOSIS — M79675 Pain in left toe(s): Secondary | ICD-10-CM

## 2020-05-19 DIAGNOSIS — M205X1 Other deformities of toe(s) (acquired), right foot: Secondary | ICD-10-CM | POA: Diagnosis not present

## 2020-05-19 DIAGNOSIS — B351 Tinea unguium: Secondary | ICD-10-CM

## 2020-05-19 NOTE — Progress Notes (Signed)
This patient returns to my office for at risk foot care.  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  This patient is unable to cut nails himself since the patient cannot reach his nails.These nails are painful walking and wearing shoes.  This patient presents for at risk foot care today.  General Appearance  Alert, conversant and in no acute stress.  Vascular  Dorsalis pedis and posterior tibial  pulses are palpable  bilaterally.  Capillary return is within normal limits  bilaterally. Temperature is within normal limits  bilaterally.  Neurologic  Senn-Weinstein monofilament wire test diminished  bilaterally. Muscle power within normal limits bilaterally.  Nails Thick disfigured discolored nails with subungual debris  from hallux to fifth toes bilaterally. No evidence of bacterial infection or drainage bilaterally.  Orthopedic  No limitations of motion  feet .  No crepitus or effusions noted.  No bony pathology or digital deformities noted.  Hallux limitus  1st MPJ  B/L.  Skin  normotropic skin with no porokeratosis noted bilaterally.  No signs of infections or ulcers noted.     Onychomycosis  Pain in right toes  Pain in left toes  Consent was obtained for treatment procedures.   Mechanical debridement of nails 1-5  bilaterally performed with a nail nipper.  Filed with dremel without incident. 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   Return office visit    3 months                  Told patient to return for periodic foot care and evaluation due to potential at risk complications.   Gardiner Barefoot DPM

## 2020-06-15 ENCOUNTER — Ambulatory Visit (INDEPENDENT_AMBULATORY_CARE_PROVIDER_SITE_OTHER): Payer: Medicare Other | Admitting: Podiatry

## 2020-06-15 ENCOUNTER — Other Ambulatory Visit: Payer: Self-pay

## 2020-06-15 DIAGNOSIS — M205X1 Other deformities of toe(s) (acquired), right foot: Secondary | ICD-10-CM

## 2020-06-15 DIAGNOSIS — E1142 Type 2 diabetes mellitus with diabetic polyneuropathy: Secondary | ICD-10-CM

## 2020-06-15 DIAGNOSIS — M205X2 Other deformities of toe(s) (acquired), left foot: Secondary | ICD-10-CM

## 2020-06-15 NOTE — Progress Notes (Addendum)
Patient presented for foam casting for 3 pair custom diabetic shoe inserts. Patient is measured with a brannok device to be a size 11 wide.  Diabetic shoes are chosen from the [employer] catalog. The shoes chosen are [id number]. The patient will contacted when the shoes and inserts are ready to be picked up.   Gardiner Barefoot DPM

## 2020-06-16 ENCOUNTER — Encounter: Payer: Self-pay | Admitting: Podiatry

## 2020-07-05 DIAGNOSIS — R311 Benign essential microscopic hematuria: Secondary | ICD-10-CM | POA: Insufficient documentation

## 2020-07-12 ENCOUNTER — Other Ambulatory Visit: Payer: Self-pay | Admitting: Urology

## 2020-08-08 ENCOUNTER — Telehealth: Payer: Self-pay | Admitting: Podiatry

## 2020-08-08 NOTE — Telephone Encounter (Signed)
Pts daughter Casilda Carls left message checking on pts diabetic shoes.   I returned call and explained that they are in and are to be dispensed by Dr Prudence Davidson at his appt on 5.5.2022. She said thank you.

## 2020-08-18 ENCOUNTER — Other Ambulatory Visit: Payer: Self-pay

## 2020-08-18 ENCOUNTER — Encounter: Payer: Self-pay | Admitting: Podiatry

## 2020-08-18 ENCOUNTER — Ambulatory Visit (INDEPENDENT_AMBULATORY_CARE_PROVIDER_SITE_OTHER): Payer: Medicare Other | Admitting: Podiatry

## 2020-08-18 DIAGNOSIS — M79674 Pain in right toe(s): Secondary | ICD-10-CM

## 2020-08-18 DIAGNOSIS — B351 Tinea unguium: Secondary | ICD-10-CM | POA: Diagnosis not present

## 2020-08-18 DIAGNOSIS — E1142 Type 2 diabetes mellitus with diabetic polyneuropathy: Secondary | ICD-10-CM

## 2020-08-18 DIAGNOSIS — M216X2 Other acquired deformities of left foot: Secondary | ICD-10-CM

## 2020-08-18 DIAGNOSIS — M79675 Pain in left toe(s): Secondary | ICD-10-CM | POA: Diagnosis not present

## 2020-08-18 DIAGNOSIS — M216X1 Other acquired deformities of right foot: Secondary | ICD-10-CM

## 2020-08-18 DIAGNOSIS — N1831 Chronic kidney disease, stage 3a: Secondary | ICD-10-CM | POA: Diagnosis not present

## 2020-08-18 NOTE — Progress Notes (Signed)
This patient returns to my office for at risk foot care.  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  This patient is unable to cut nails himself since the patient cannot reach his nails.These nails are painful walking and wearing shoes.  This patient presents for at risk foot care today.  General Appearance  Alert, conversant and in no acute stress.  Vascular  Dorsalis pedis and posterior tibial  pulses are palpable  bilaterally.  Capillary return is within normal limits  bilaterally. Temperature is within normal limits  bilaterally.  Neurologic  Senn-Weinstein monofilament wire test diminished  bilaterally. Muscle power within normal limits bilaterally.  Nails Thick disfigured discolored nails with subungual debris  from hallux to fifth toes bilaterally. No evidence of bacterial infection or drainage bilaterally.  Orthopedic  No limitations of motion  feet .  No crepitus or effusions noted.  No bony pathology or digital deformities noted.  Hallux limitus  1st MPJ  B/L.  Skin  normotropic skin with no porokeratosis noted bilaterally.  No signs of infections or ulcers noted.     Onychomycosis  Pain in right toes  Pain in left toes  Consent was obtained for treatment procedures.   Mechanical debridement of nails 1-5  bilaterally performed with a nail nipper.  Filed with dremel without incident. Dispense diabetic shoes.  Patient presents today and was dispensed 0ne pair ( two units) of medically necessary extra depth shoes with three pair( six units) of custom molded multiple density inserts. The shoes and the inserts are fitted to the patients ' feet and are noted to fit well and are free of defect.  Length and width of the shoes are also acceptable.  Patient was given written and verbal  instructions for wearing.  If any concerns arrive with the shoes or inserts, the patient is to call the office.Patient is to  follow up with doctor in six weeks.   Return office visit   10 weeks                  Told patient to return for periodic foot care and evaluation due to potential at risk complications.   Gardiner Barefoot DPM

## 2020-09-04 ENCOUNTER — Other Ambulatory Visit: Payer: Self-pay | Admitting: Urology

## 2020-09-05 ENCOUNTER — Other Ambulatory Visit: Payer: Self-pay | Admitting: *Deleted

## 2020-09-05 MED ORDER — FINASTERIDE 5 MG PO TABS: 5.0000 mg | ORAL_TABLET | Freq: Every day | ORAL | 2 refills | Status: DC

## 2020-10-11 ENCOUNTER — Ambulatory Visit: Payer: Self-pay | Admitting: Physician Assistant

## 2020-10-12 ENCOUNTER — Other Ambulatory Visit: Payer: Self-pay

## 2020-10-12 ENCOUNTER — Encounter: Payer: Self-pay | Admitting: Physician Assistant

## 2020-10-12 ENCOUNTER — Ambulatory Visit (INDEPENDENT_AMBULATORY_CARE_PROVIDER_SITE_OTHER): Payer: Medicare Other | Admitting: Physician Assistant

## 2020-10-12 VITALS — BP 201/83 | HR 58 | Ht 70.0 in | Wt 220.0 lb

## 2020-10-12 DIAGNOSIS — R310 Gross hematuria: Secondary | ICD-10-CM | POA: Diagnosis not present

## 2020-10-12 NOTE — Progress Notes (Signed)
10/12/2020 9:32 AM   Virgina Norfolk Dec 05, 1932 [id number]  CC: Chief Complaint  Patient presents with   Follow-up   HPI: Timothy Duke is a 87 y.o. male with PMH BPH and gross hematuria with benign work-up in December 2021, now on finasteride, who presents today for follow-up and repeat UA.   Today he reports decreased frequency of gross hematuria on finasteride.  He states overall he feels his voiding has improved and he denies dysuria today.  In-office UA today positive for 2+ glucose and 2+ blood; urine microscopy with >30 RBCs/HPF.  PMH: Past Medical History:  Diagnosis Date   Diabetes mellitus without complication ([place])     Surgical History: No past surgical history on file.  Home Medications:  Allergies as of 10/12/2020   No Known Allergies      Medication List        Accurate as of October 12, 2020  9:32 AM. If you have any questions, ask your nurse or doctor.          Acetaminophen 500 MG capsule Take by mouth.   amLODipine 10 MG tablet Commonly known as: NORVASC Take by mouth.   aspirin EC 81 MG tablet Take by mouth.   empagliflozin 10 MG Tabs tablet Commonly known as: JARDIANCE Take by mouth.   finasteride 5 MG tablet Commonly known as: PROSCAR Take 1 tablet (5 mg total) by mouth daily.   glimepiride 4 MG tablet Commonly known as: AMARYL TAKE 1 TABLET BY MOUTH IN THE MORNING FOR  SUGAR   glipiZIDE 10 MG 24 hr tablet Commonly known as: GLUCOTROL XL Take by mouth.   hydrALAZINE 50 MG tablet Commonly known as: APRESOLINE TAKE ONE TABLET BY MOUTH TWICE DAILY   hydrochlorothiazide 25 MG tablet Commonly known as: HYDRODIURIL Take by mouth.   latanoprost 0.005 % ophthalmic solution Commonly known as: XALATAN Apply to eye.   lisinopril-hydrochlorothiazide 20-12.5 MG tablet Commonly known as: ZESTORETIC TAKE ONE TABLET BY MOUTH ONCE DAILY   meloxicam 7.5 MG tablet Commonly known as: MOBIC Take by mouth.   metFORMIN 1000 MG  tablet Commonly known as: GLUCOPHAGE TAKE ONE TABLET BY MOUTH TWICE DAILY WITH MEALS   Multi-Vitamins Tabs Take by mouth.   Ozempic (0.25 or 0.5 MG/DOSE) 2 MG/1.5ML Sopn Generic drug: Semaglutide(0.25 or 0.5MG /DOS) Inject 0.5 mg into the skin once a week.   glucose blood test strip USE AS DIRECTED TWICE DAILY   Prodigy No Coding Blood Gluc test strip Generic drug: glucose blood 2 (two) times daily. as directed   Prodigy Twist Top Lancets 28G Misc USE TO CHECK BLOOD SUGAR TWICE DAILY AS INSTRUCTED   sitaGLIPtin 100 MG tablet Commonly known as: JANUVIA Take by mouth.   timolol 0.5 % ophthalmic solution Commonly known as: TIMOPTIC Apply to eye.   Tradjenta 5 MG Tabs tablet Generic drug: linagliptin Take by mouth.        Allergies:  No Known Allergies  Family History: No family history on file.  Social History:   reports that he has never smoked. His smokeless tobacco use includes chew. He reports that he does not drink alcohol and does not use drugs.  Physical Exam: BP (!) 201/83   Pulse (!) 58   Ht 5\' 10"  (1.778 m)   Wt 220 lb (99.8 kg)   BMI 31.57 kg/m   Constitutional:  Alert and oriented, no acute distress, nontoxic appearing HEENT: [place], AT Cardiovascular: No clubbing, cyanosis, or edema Respiratory: Normal respiratory effort, no increased  work of breathing Skin: No rashes, bruises or suspicious lesions Neurologic: Grossly intact, no focal deficits, moving all 4 extremities Psychiatric: Normal mood and affect  Laboratory Data: Results for orders placed or performed in visit on 10/12/20  Microscopic Examination   Urine  Result Value Ref Range   WBC, UA 0-5 0 - 5 /hpf   RBC >30 (A) 0 - 2 /hpf   Epithelial Cells (non renal) 0-10 0 - 10 /hpf   Bacteria, UA None seen None seen/Few  Urinalysis, Complete  Result Value Ref Range   Specific Gravity, UA 1.020 1.005 - 1.030   pH, UA 7.0 5.0 - 7.5   Color, UA Yellow Yellow   Appearance Ur Cloudy (A)  Clear   Leukocytes,UA Negative Negative   Protein,UA Negative Negative/Trace   Glucose, UA 2+ (A) Negative   Ketones, UA Negative Negative   RBC, UA 2+ (A) Negative   Bilirubin, UA Negative Negative   Urobilinogen, Ur 0.2 0.2 - 1.0 mg/dL   Nitrite, UA Negative Negative   Microscopic Examination See below:    Assessment & Plan:   1. Gross hematuria Improving on finasteride with recently benign work-up.  Counseled him that I suspect this will become more infrequent over time and he should plan to continue finasteride indefinitely.  We reviewed return precautions today including passage of thick, ketchup-like urine, passing large blood clots, and dysuria.  He expressed understanding. - Urinalysis, Complete  Return in about 1 year (around 10/12/2021) for Symptom recheck and UA.  Debroah Loop, PA-C  [facility] [address], [place], [place] [address] [phone]

## 2020-10-12 NOTE — Patient Instructions (Signed)
Today we discussed that you are seeing blood in your urine less than you did before you started finasteride. This is great news and means the medication is working. I'll plan to see you once a year to check up on you moving forward, but you should come back to clinic sooner if you develop any of the following symptoms: -Thick, dark red bleeding from the penis that resembles ketchup -Passing large blood clots (like the size of your palm) -Pain with urination

## 2020-10-13 LAB — URINALYSIS, COMPLETE
Bilirubin, UA: NEGATIVE
Ketones, UA: NEGATIVE
Leukocytes,UA: NEGATIVE
Nitrite, UA: NEGATIVE
Protein,UA: NEGATIVE
Specific Gravity, UA: 1.02 (ref 1.005–1.030)
Urobilinogen, Ur: 0.2 mg/dL (ref 0.2–1.0)
pH, UA: 7 (ref 5.0–7.5)

## 2020-10-13 LAB — MICROSCOPIC EXAMINATION
Bacteria, UA: NONE SEEN
RBC, Urine: >30 /hpf — AB (ref 0–2)

## 2020-10-27 ENCOUNTER — Encounter: Payer: Self-pay | Admitting: Podiatry

## 2020-10-27 ENCOUNTER — Ambulatory Visit (INDEPENDENT_AMBULATORY_CARE_PROVIDER_SITE_OTHER): Payer: Medicare Other | Admitting: Podiatry

## 2020-10-27 ENCOUNTER — Other Ambulatory Visit: Payer: Self-pay

## 2020-10-27 DIAGNOSIS — E1142 Type 2 diabetes mellitus with diabetic polyneuropathy: Secondary | ICD-10-CM | POA: Diagnosis not present

## 2020-10-27 DIAGNOSIS — B351 Tinea unguium: Secondary | ICD-10-CM | POA: Diagnosis not present

## 2020-10-27 DIAGNOSIS — M79675 Pain in left toe(s): Secondary | ICD-10-CM

## 2020-10-27 DIAGNOSIS — N1831 Chronic kidney disease, stage 3a: Secondary | ICD-10-CM

## 2020-10-27 DIAGNOSIS — M79674 Pain in right toe(s): Secondary | ICD-10-CM

## 2020-10-27 NOTE — Progress Notes (Signed)
This patient returns to my office for at risk foot care.  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  This patient is unable to cut nails himself since the patient cannot reach his nails.These nails are painful walking and wearing shoes.  This patient presents for at risk foot care today.  General Appearance  Alert, conversant and in no acute stress.  Vascular  Dorsalis pedis and posterior tibial  pulses are palpable  bilaterally.  Capillary return is within normal limits  bilaterally. Temperature is within normal limits  bilaterally.  Neurologic  Senn-Weinstein monofilament wire test diminished  bilaterally. Muscle power within normal limits bilaterally.  Nails Thick disfigured discolored nails with subungual debris  from hallux to fifth toes bilaterally. No evidence of bacterial infection or drainage bilaterally.  Orthopedic  No limitations of motion  feet .  No crepitus or effusions noted.  No bony pathology or digital deformities noted.  Hallux limitus  1st MPJ  B/L.  Skin  normotropic skin with no porokeratosis noted bilaterally.  No signs of infections or ulcers noted.     Onychomycosis  Pain in right toes  Pain in left toes  Consent was obtained for treatment procedures.   Mechanical debridement of nails 1-5  bilaterally performed with a nail nipper.  Filed with dremel without incident.    Return office visit   10 weeks                  Told patient to return for periodic foot care and evaluation due to potential at risk complications.   Gardiner Barefoot DPM

## 2021-01-06 ENCOUNTER — Telehealth: Payer: Self-pay | Admitting: *Deleted

## 2021-01-06 NOTE — Telephone Encounter (Signed)
Patient's daughter called to report ongoing hematuria. He noticed some blood in his urine this morning. Denies any pain, blood clots or unable to void. Reviewed worsening symptoms to be aware of to seek medical attention. Will call the office if symptoms persist.

## 2021-01-09 NOTE — Telephone Encounter (Signed)
Spoke with patient's daughter regarding ongoing hematuria. He has been having blood in his urine over the weekend was darker then cleared up but still ongoing. Denies any other symptoms. Scheduled appointment for evaluation.

## 2021-01-09 NOTE — Telephone Encounter (Signed)
Daughter called in today and let a voice mail for you to call her back about her dad .

## 2021-01-10 ENCOUNTER — Ambulatory Visit (INDEPENDENT_AMBULATORY_CARE_PROVIDER_SITE_OTHER): Payer: Medicare Other | Admitting: Physician Assistant

## 2021-01-10 ENCOUNTER — Other Ambulatory Visit: Payer: Self-pay

## 2021-01-10 VITALS — BP 197/74 | HR 58 | Ht 71.0 in | Wt 223.0 lb

## 2021-01-10 DIAGNOSIS — R310 Gross hematuria: Secondary | ICD-10-CM | POA: Diagnosis not present

## 2021-01-10 LAB — BLADDER SCAN AMB NON-IMAGING

## 2021-01-10 NOTE — Progress Notes (Addendum)
01/10/2021 3:20 PM   Virgina Norfolk Jul 11, 1932 [id number]  CC: Chief Complaint  Patient presents with   Hematuria   HPI: Timothy Duke is a 87 y.o. male with PMH BPH and gross hematuria with benign work-up in December 2021, now on finasteride, who presents today for follow-up of continued hematuria.  He is accompanied today by his daughter, who contributes to HPI.  Today he reports ongoing, intermittent gross hematuria, most recently this weekend that has been progressively improving.  He states at its worst his urine is cherry colored but without clots.  He denies dysuria or flank pain.  He is still taking finasteride as prescribed.  He states his urine will typically clear for only 3 to 4 days before his gross hematuria resumes.  In-office UA today positive for 2+ glucose, 3+ blood, and 1+ protein; urine microscopy with 6-10 WBCs/HPF and >30 RBCs/HPF. PVR 44mL.  PMH: Past Medical History:  Diagnosis Date   Diabetes mellitus without complication ([place])     Surgical History: No past surgical history on file.  Home Medications:  Allergies as of 01/10/2021   No Known Allergies      Medication List        Accurate as of January 10, 2021  3:20 PM. If you have any questions, ask your nurse or doctor.          Acetaminophen 500 MG capsule Take by mouth.   amLODipine 10 MG tablet Commonly known as: NORVASC Take by mouth.   aspirin EC 81 MG tablet Take by mouth.   finasteride 5 MG tablet Commonly known as: PROSCAR Take 1 tablet (5 mg total) by mouth daily.   glimepiride 4 MG tablet Commonly known as: AMARYL TAKE 1 TABLET BY MOUTH IN THE MORNING FOR  SUGAR   glipiZIDE 10 MG 24 hr tablet Commonly known as: GLUCOTROL XL Take by mouth.   hydrALAZINE 50 MG tablet Commonly known as: APRESOLINE TAKE ONE TABLET BY MOUTH TWICE DAILY   hydrochlorothiazide 25 MG tablet Commonly known as: HYDRODIURIL Take by mouth.   latanoprost 0.005 % ophthalmic  solution Commonly known as: XALATAN Apply to eye.   lisinopril-hydrochlorothiazide 20-12.5 MG tablet Commonly known as: ZESTORETIC TAKE ONE TABLET BY MOUTH ONCE DAILY   meloxicam 7.5 MG tablet Commonly known as: MOBIC Take by mouth.   metFORMIN 1000 MG tablet Commonly known as: GLUCOPHAGE TAKE ONE TABLET BY MOUTH TWICE DAILY WITH MEALS   Multi-Vitamins Tabs Take by mouth.   Ozempic (0.25 or 0.5 MG/DOSE) 2 MG/1.5ML Sopn Generic drug: Semaglutide(0.25 or 0.5MG /DOS) Inject 0.5 mg into the skin once a week.   glucose blood test strip USE AS DIRECTED TWICE DAILY   Prodigy No Coding Blood Gluc test strip Generic drug: glucose blood 2 (two) times daily. as directed   Prodigy Twist Top Lancets 28G Misc USE TO CHECK BLOOD SUGAR TWICE DAILY AS INSTRUCTED   sitaGLIPtin 100 MG tablet Commonly known as: JANUVIA Take by mouth.   timolol 0.5 % ophthalmic solution Commonly known as: TIMOPTIC Apply to eye.   Tradjenta 5 MG Tabs tablet Generic drug: linagliptin Take by mouth.        Allergies:  No Known Allergies  Family History: No family history on file.  Social History:   reports that he has never smoked. His smokeless tobacco use includes chew. He reports that he does not drink alcohol and does not use drugs.  Physical Exam: BP (!) 197/74   Pulse (!) 58   Ht  5\' 11"  (1.803 m)   Wt 223 lb (101.2 kg)   BMI 31.10 kg/m   Constitutional:  Alert and oriented, no acute distress, nontoxic appearing HEENT: [place], AT Cardiovascular: No clubbing, cyanosis, or edema Respiratory: Normal respiratory effort, no increased work of breathing Skin: No rashes, bruises or suspicious lesions Neurologic: Grossly intact, no focal deficits, moving all 4 extremities Psychiatric: Normal mood and affect  Laboratory Data: Results for orders placed or performed in visit on 01/10/21  Microscopic Examination   Urine  Result Value Ref Range   WBC, UA 6-10 (A) 0 - 5 /hpf   RBC >30 (A) 0 -  2 /hpf   Epithelial Cells (non renal) 0-10 0 - 10 /hpf   Bacteria, UA None seen None seen/Few  Urinalysis, Complete  Result Value Ref Range   Specific Gravity, UA 1.010 1.005 - 1.030   pH, UA 6.0 5.0 - 7.5   Color, UA Orange Yellow   Appearance Ur Cloudy (A) Clear   Leukocytes,UA Negative Negative   Protein,UA 1+ (A) Negative/Trace   Glucose, UA 2+ (A) Negative   Ketones, UA Negative Negative   RBC, UA 3+ (A) Negative   Bilirubin, UA Negative Negative   Urobilinogen, Ur 0.2 0.2 - 1.0 mg/dL   Nitrite, UA Negative Negative   Microscopic Examination See below:   Hemoglobin and hematocrit, blood  Result Value Ref Range   Hemoglobin 15.7 13.0 - 17.7 g/dL   Hematocrit 47.1 37.5 - 51.0 %  BLADDER SCAN AMB NON-IMAGING  Result Value Ref Range   Scan Result 31mL    Assessment & Plan:   1. Gross hematuria Most likely prostatic in origin given prior cystoscopy findings.  No warning signs of gross hematuria on interview today.  UA today with some pyuria, so will send for culture to rule out underlying infection, though I suspect this will be negative.  We will continue finasteride for now, however given ongoing bleeding we will check an H&H and have him follow-up with Dr. Bernardo Heater for discussion of possible outlet procedure versus PAE.  Patient and daughter are in agreement with this plan. - Urinalysis, Complete - CULTURE, URINE COMPREHENSIVE - BLADDER SCAN AMB NON-IMAGING - Hemoglobin and hematocrit, blood   Return in about 1 week (around 01/17/2021) for Gross hematuria follow-up with Dr. Bernardo Heater.  Debroah Loop, PA-C  [facility] [address], [place], [place] [address] [phone]

## 2021-01-11 LAB — URINALYSIS, COMPLETE
Bilirubin, UA: NEGATIVE
Ketones, UA: NEGATIVE
Leukocytes,UA: NEGATIVE
Nitrite, UA: NEGATIVE
Specific Gravity, UA: 1.01 (ref 1.005–1.030)
Urobilinogen, Ur: 0.2 mg/dL (ref 0.2–1.0)
pH, UA: 6 (ref 5.0–7.5)

## 2021-01-11 LAB — MICROSCOPIC EXAMINATION
Bacteria, UA: NONE SEEN
RBC, Urine: >30 /hpf — AB (ref 0–2)

## 2021-01-11 LAB — HEMOGLOBIN AND HEMATOCRIT, BLOOD
Hematocrit: 47.1 % (ref 37.5–51.0)
Hemoglobin: 15.7 g/dL (ref 13.0–17.7)

## 2021-01-12 ENCOUNTER — Telehealth: Payer: Self-pay

## 2021-01-12 NOTE — Telephone Encounter (Signed)
Notified patients daughter, Kieth Brightly (ok per DPR) as advised, she expressed understanding.

## 2021-01-12 NOTE — Telephone Encounter (Signed)
-----   Message from Debroah Loop, [place] sent at 01/12/2021  8:15 AM EDT ----- Blood counts stable, great news! Follow up as scheduled. ----- Message ----- From: Lavone Neri Lab Results In Sent: 01/11/2021   5:38 AM EDT To: Debroah Loop, PA-C

## 2021-01-14 LAB — CULTURE, URINE COMPREHENSIVE

## 2021-01-18 ENCOUNTER — Other Ambulatory Visit: Payer: Self-pay

## 2021-01-18 ENCOUNTER — Encounter: Payer: Self-pay | Admitting: Urology

## 2021-01-18 ENCOUNTER — Ambulatory Visit (INDEPENDENT_AMBULATORY_CARE_PROVIDER_SITE_OTHER): Payer: Medicare Other | Admitting: Urology

## 2021-01-18 VITALS — BP 206/76 | HR 66 | Ht 72.0 in | Wt 223.0 lb

## 2021-01-18 DIAGNOSIS — R310 Gross hematuria: Secondary | ICD-10-CM | POA: Diagnosis not present

## 2021-01-18 DIAGNOSIS — N400 Enlarged prostate without lower urinary tract symptoms: Secondary | ICD-10-CM | POA: Diagnosis not present

## 2021-01-18 NOTE — Progress Notes (Signed)
01/18/2021 1:47 PM   Virgina Norfolk 1932/12/22 [id number]  Referring provider: Rusty Aus, MD [facility] [place],  [place] [address]  Chief Complaint  Patient presents with   Hematuria    HPI: 87 y.o. male presents for follow-up of hematuria.  Refer to Sam Vaillancourt's prior note 01/10/2021 Continues to have intermittent hematuria every few days No clots noted No bothersome LUTS   PMH: Past Medical History:  Diagnosis Date   Diabetes mellitus without complication ([place])     Surgical History: History reviewed. No pertinent surgical history.  Home Medications:  Allergies as of 01/18/2021   No Known Allergies      Medication List        Accurate as of January 18, 2021  1:47 PM. If you have any questions, ask your nurse or doctor.          STOP taking these medications    glimepiride 4 MG tablet Commonly known as: AMARYL Stopped by: Abbie Sons, MD   meloxicam 7.5 MG tablet Commonly known as: MOBIC Stopped by: Abbie Sons, MD   metFORMIN 1000 MG tablet Commonly known as: GLUCOPHAGE Stopped by: Abbie Sons, MD   sitaGLIPtin 100 MG tablet Commonly known as: JANUVIA Stopped by: Abbie Sons, MD       TAKE these medications    Acetaminophen 500 MG capsule Take by mouth.   amLODipine 10 MG tablet Commonly known as: NORVASC Take by mouth.   aspirin EC 81 MG tablet Take by mouth.   finasteride 5 MG tablet Commonly known as: PROSCAR Take 1 tablet (5 mg total) by mouth daily.   glipiZIDE 10 MG 24 hr tablet Commonly known as: GLUCOTROL XL Take by mouth.   hydrALAZINE 50 MG tablet Commonly known as: APRESOLINE TAKE ONE TABLET BY MOUTH TWICE DAILY   hydrochlorothiazide 25 MG tablet Commonly known as: HYDRODIURIL Take by mouth.   latanoprost 0.005 % ophthalmic solution Commonly known as: XALATAN Apply to eye.   lisinopril-hydrochlorothiazide 20-12.5 MG tablet Commonly  known as: ZESTORETIC TAKE ONE TABLET BY MOUTH ONCE DAILY   Multi-Vitamins Tabs Take by mouth.   Ozempic (0.25 or 0.5 MG/DOSE) 2 MG/1.5ML Sopn Generic drug: Semaglutide(0.25 or 0.5MG /DOS) Inject 0.5 mg into the skin once a week.   glucose blood test strip USE AS DIRECTED TWICE DAILY   Prodigy No Coding Blood Gluc test strip Generic drug: glucose blood 2 (two) times daily. as directed   Prodigy Twist Top Lancets 28G Misc USE TO CHECK BLOOD SUGAR TWICE DAILY AS INSTRUCTED   timolol 0.5 % ophthalmic solution Commonly known as: TIMOPTIC Apply to eye.   Tradjenta 5 MG Tabs tablet Generic drug: linagliptin Take by mouth.        Allergies: No Known Allergies  Family History: History reviewed. No pertinent family history.  Social History:  reports that he has never smoked. His smokeless tobacco use includes chew. He reports that he does not drink alcohol and does not use drugs.   Physical Exam: BP (!) 206/76   Pulse 66   Ht 6' (1.829 m)   Wt 223 lb (101.2 kg)   BMI 30.24 kg/m   Constitutional:  Alert and oriented, No acute distress. HEENT: [place] AT, moist mucus membranes.  Trachea midline, no masses. Cardiovascular: No clubbing, cyanosis, or edema. Respiratory: Normal respiratory effort, no increased work of breathing. Psychiatric: Normal mood and affect.    Assessment & Plan:    1. Timothy Duke  hematuria No upper tract abnormalities identified on MRI Prostate volume by MRI was calculated at 55 cc Has been on finasteride >6 months Recommend follow-up cystoscopy for second look Since he does not have significant lower urinary tract symptoms TURP or HoLEP would potentially have increased morbidity We discussed PAE however the closest facility performing is in [place] Will message one of my colleagues in [place] doing [place] to see if he has performed for chronic hematuria secondary to BPH  2.  BPH with hematuria As above   Abbie Sons, MD  [facility] [address], [place], [place] [address] [phone]

## 2021-01-19 ENCOUNTER — Encounter: Payer: Self-pay | Admitting: Urology

## 2021-01-19 LAB — URINALYSIS, COMPLETE
Bilirubin, UA: NEGATIVE
Ketones, UA: NEGATIVE
Leukocytes,UA: NEGATIVE
Nitrite, UA: NEGATIVE
Specific Gravity, UA: 1.01 (ref 1.005–1.030)
Urobilinogen, Ur: 0.2 mg/dL (ref 0.2–1.0)
pH, UA: 6.5 (ref 5.0–7.5)

## 2021-01-19 LAB — MICROSCOPIC EXAMINATION
Bacteria, UA: NONE SEEN
RBC, Urine: >30 /hpf — AB (ref 0–2)

## 2021-01-20 ENCOUNTER — Telehealth: Payer: Self-pay | Admitting: Family Medicine

## 2021-01-20 NOTE — Telephone Encounter (Signed)
-----   Message from Abbie Sons, MD sent at 01/19/2021  9:15 PM EDT ----- Please contact pt's daughter- recc sched repeat cysto to make sure no changes the discuss treatment options further

## 2021-01-20 NOTE — Telephone Encounter (Signed)
Patient's daughter notified and appointment has been made. 

## 2021-02-02 ENCOUNTER — Ambulatory Visit: Payer: Medicare Other | Admitting: Podiatry

## 2021-02-16 ENCOUNTER — Ambulatory Visit (INDEPENDENT_AMBULATORY_CARE_PROVIDER_SITE_OTHER): Payer: Medicare Other | Admitting: Urology

## 2021-02-16 ENCOUNTER — Other Ambulatory Visit: Payer: Self-pay

## 2021-02-16 ENCOUNTER — Encounter: Payer: Self-pay | Admitting: Urology

## 2021-02-16 VITALS — BP 198/72 | HR 65 | Ht 72.0 in | Wt 223.0 lb

## 2021-02-16 DIAGNOSIS — R310 Gross hematuria: Secondary | ICD-10-CM

## 2021-02-16 LAB — MICROSCOPIC EXAMINATION
Epithelial Cells (non renal): NONE SEEN /hpf (ref 0–10)
RBC, Urine: >30 /hpf — ABNORMAL HIGH (ref 0–2)
WBC, UA: NONE SEEN /hpf (ref 0–5)

## 2021-02-16 LAB — URINALYSIS, COMPLETE
Bilirubin, UA: NEGATIVE
Ketones, UA: NEGATIVE
Leukocytes,UA: NEGATIVE
Nitrite, UA: NEGATIVE
Specific Gravity, UA: 1.015 (ref 1.005–1.030)
Urobilinogen, Ur: 0.2 mg/dL (ref 0.2–1.0)
pH, UA: 7.5 (ref 5.0–7.5)

## 2021-02-16 NOTE — Progress Notes (Signed)
   02/16/21  CC:  Chief Complaint  Patient presents with   Cysto    HPI: History recurrent gross hematuria; no upper tract abnormalities MR urogram (CKD) and cystoscopy remarkable for BPH with hypervascularity.  Persistent intermittent gross hematuria on finasteride and follow-up cystoscopy recommended.  UA today with >30 RBC  Blood pressure (!) 198/72, pulse 65, height 6' (1.829 m), weight 223 lb (101.2 kg). NED. A&Ox3.   No respiratory distress   Abd soft, NT, ND Normal phallus with bilateral descended testicles  Cystoscopy Procedure Note  Patient identification was confirmed, informed consent was obtained, and patient was prepped using Betadine solution.  Lidocaine jelly was administered per urethral meatus.     Pre-Procedure: - Inspection reveals a normal caliber urethral meatus.  Procedure: The flexible cystoscope was introduced without difficulty - No urethral strictures/lesions are present. - Suboptimal visualization initially and 180 mL blood-tinged urine was removed via 60 cc syringe. -  Moderate lateral lobe enlargement  prostate with hypervascularity - Elevated bladder neck, mild to moderate - Bilateral ureteral orifices identified - Bladder mucosa  reveals no ulcers, tumors, or lesions - No bladder stones -Moderate trabeculation  Retroflexion shows no intravesical median lobe   Post-Procedure: - Patient tolerated the procedure well  Assessment/ Plan: No bladder mucosal abnormalities on repeat cystoscopy BPH with hypervascularity however no active bleeding from the prostate noted today and urine was grossly bloody Recommend scheduling cystoscopy under anesthesia and bilateral retrograde pyelograms with possible ureteroscopy.  If no upper tract abnormalities are identified we will proceed with transurethral vaporization prostate   Abbie Sons, MD

## 2021-02-16 NOTE — H&P (View-Only) (Signed)
   02/16/21  CC:  Chief Complaint  Patient presents with   Cysto    HPI: History recurrent gross hematuria; no upper tract abnormalities MR urogram (CKD) and cystoscopy remarkable for BPH with hypervascularity.  Persistent intermittent gross hematuria on finasteride and follow-up cystoscopy recommended.  UA today with >30 RBC  Blood pressure (!) 198/72, pulse 65, height 6' (1.829 m), weight 223 lb (101.2 kg). NED. A&Ox3.   No respiratory distress   Abd soft, NT, ND Normal phallus with bilateral descended testicles  Cystoscopy Procedure Note  Patient identification was confirmed, informed consent was obtained, and patient was prepped using Betadine solution.  Lidocaine jelly was administered per urethral meatus.     Pre-Procedure: - Inspection reveals a normal caliber urethral meatus.  Procedure: The flexible cystoscope was introduced without difficulty - No urethral strictures/lesions are present. - Suboptimal visualization initially and 180 mL blood-tinged urine was removed via 60 cc syringe. -  Moderate lateral lobe enlargement  prostate with hypervascularity - Elevated bladder neck, mild to moderate - Bilateral ureteral orifices identified - Bladder mucosa  reveals no ulcers, tumors, or lesions - No bladder stones -Moderate trabeculation  Retroflexion shows no intravesical median lobe   Post-Procedure: - Patient tolerated the procedure well  Assessment/ Plan: No bladder mucosal abnormalities on repeat cystoscopy BPH with hypervascularity however no active bleeding from the prostate noted today and urine was grossly bloody Recommend scheduling cystoscopy under anesthesia and bilateral retrograde pyelograms with possible ureteroscopy.  If no upper tract abnormalities are identified we will proceed with transurethral vaporization prostate   Abbie Sons, MD

## 2021-02-19 ENCOUNTER — Other Ambulatory Visit: Payer: Self-pay | Admitting: Urology

## 2021-02-19 DIAGNOSIS — R310 Gross hematuria: Secondary | ICD-10-CM

## 2021-02-19 DIAGNOSIS — N400 Enlarged prostate without lower urinary tract symptoms: Secondary | ICD-10-CM

## 2021-02-19 NOTE — Progress Notes (Signed)
Surgical Physician Order Form  * Scheduling expectation : Next Available  *Length of Case: 90 minutes  *Clearance needed: Per anesthesia  *Anticoagulation Instructions: Hold all anticoagulants  *Aspirin Instructions: Hold Aspirin  *Post-op visit Date/Instructions:  1-3 day cath removal  *Diagnosis:  Gross hematuria, BPH  *Procedure: Cystoscopy with bilateral retrograde pyelogram; possible bilateral ureteroscopy; transurethral vaporization prostate  -Admit type: OUTpatient  -Anesthesia: Choice  -VTE Prophylaxis Standing Order SCD's       Other:   -Standing Lab Orders Per Anesthesia    Lab other: UA&Urine Culture  -Standing Test orders EKG/Chest x-ray per Anesthesia       Test other:   - Medications:     Ancef 2gm IV   Other Instructions:

## 2021-02-20 ENCOUNTER — Telehealth: Payer: Self-pay | Admitting: Urology

## 2021-02-20 NOTE — Telephone Encounter (Addendum)
Per Dr. Bernardo Heater Patient is to be scheduled for Transurethral Vaporization of Prostate,Cystoscopy with Bilateral Retrograde Pyelogram and Possible Bilateral Ureteroscopy.  Daughter Aura Fey was contacted and possible surgical dates were discussed, 03/14/21 was agreed upon for surgery. Patient was instructed that Dr. Bernardo Heater will require them to provide a pre-op UA & CX prior to surgery. This was ordered and scheduled drop off appointment was made for 03/06/21.   Patient was directed to call [phone] between 1-3pm the day before surgery to find out surgical arrival time.  Instructions were given not to eat or drink from midnight on the night before surgery and have a driver for the day of surgery. On the surgery day patient was instructed to enter through the [place] entrance of [facility] report the Same Day Surgery desk.   Pre-Admit Testing will be in contact via phone to set up an interview with the anesthesia team to review your history and medications prior to surgery.   Reminder of this information was sent via mychart to the patient.

## 2021-02-21 ENCOUNTER — Encounter: Payer: Self-pay | Admitting: Urgent Care

## 2021-02-21 NOTE — Progress Notes (Signed)
[facility] Posting Form   Surgery Date/Time: Date: 03/14/2021  Surgeon: Dr. John Giovanni, MD  Surgery Location: Day Surgery  Inpt ( No  )   Outpt (Yes)   Obs ( No  )   Diagnosis: R31.0 Hematuria; N40.0 Benign Prostatic Hyperplasia  -CPT: 33383,29191  Surgery: Transurethral Vaporization of the prostate and cystoscopy with bilateral retrograde pyelograms  -CPT: 66060  Surgery: Possible bilateral retrograde pyelograms  Stop Anticoagulations: Yes; Hold ASA too  Cardiac/Medical/Pulmonary Clearance needed: No  *Orders entered into EPIC  Date: 02/21/21   *Case booked in EPIC  Date: 02/20/2021  *Notified pt of Surgery: Date: 02/20/2021  PRE-OP UA & CX: Yes, obtained on 02/16/2021  *Placed into Prior Authorization Work [name] Date: 02/21/2021   Assistant/laser/rep:No

## 2021-03-02 ENCOUNTER — Ambulatory Visit (INDEPENDENT_AMBULATORY_CARE_PROVIDER_SITE_OTHER): Payer: Medicare Other | Admitting: Podiatry

## 2021-03-02 ENCOUNTER — Encounter: Payer: Self-pay | Admitting: Podiatry

## 2021-03-02 ENCOUNTER — Other Ambulatory Visit: Payer: Self-pay

## 2021-03-02 DIAGNOSIS — M79674 Pain in right toe(s): Secondary | ICD-10-CM

## 2021-03-02 DIAGNOSIS — N1831 Chronic kidney disease, stage 3a: Secondary | ICD-10-CM

## 2021-03-02 DIAGNOSIS — B351 Tinea unguium: Secondary | ICD-10-CM | POA: Diagnosis not present

## 2021-03-02 DIAGNOSIS — E1142 Type 2 diabetes mellitus with diabetic polyneuropathy: Secondary | ICD-10-CM

## 2021-03-02 DIAGNOSIS — M79675 Pain in left toe(s): Secondary | ICD-10-CM

## 2021-03-02 NOTE — Progress Notes (Signed)
This patient returns to my office for at risk foot care.  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  This patient is unable to cut nails himself since the patient cannot reach his nails.These nails are painful walking and wearing shoes.  This patient presents for at risk foot care today.  General Appearance  Alert, conversant and in no acute stress.  Vascular  Dorsalis pedis and posterior tibial  pulses are palpable  bilaterally.  Capillary return is within normal limits  bilaterally. Temperature is within normal limits  bilaterally.  Neurologic  Senn-Weinstein monofilament wire test diminished  bilaterally. Muscle power within normal limits bilaterally.  Nails Thick disfigured discolored nails with subungual debris  from hallux to fifth toes bilaterally. No evidence of bacterial infection or drainage bilaterally.  Orthopedic  No limitations of motion  feet .  No crepitus or effusions noted.  No bony pathology or digital deformities noted.  Hallux limitus  1st MPJ  B/L.  Skin  normotropic skin with no porokeratosis noted bilaterally.  No signs of infections or ulcers noted.     Onychomycosis  Pain in right toes  Pain in left toes  Consent was obtained for treatment procedures.   Mechanical debridement of nails 1-5  bilaterally performed with a nail nipper.  Filed with dremel without incident.    Return office visit   10 weeks                  Told patient to return for periodic foot care and evaluation due to potential at risk complications.   Gardiner Barefoot DPM

## 2021-03-03 ENCOUNTER — Other Ambulatory Visit: Payer: Self-pay

## 2021-03-03 ENCOUNTER — Encounter
Admission: RE | Admit: 2021-03-03 | Discharge: 2021-03-03 | Disposition: A | Discharge status: Home / Self Care | Payer: Medicare Other | Source: Ambulatory Visit | Attending: Urology | Admitting: Urology

## 2021-03-03 VITALS — Ht 72.0 in | Wt 210.0 lb

## 2021-03-03 DIAGNOSIS — I10 Essential (primary) hypertension: Secondary | ICD-10-CM

## 2021-03-03 DIAGNOSIS — N1831 Chronic kidney disease, stage 3a: Secondary | ICD-10-CM

## 2021-03-03 HISTORY — DX: Essential (primary) hypertension: I10

## 2021-03-03 HISTORY — DX: Spinal stenosis, lumbar region without neurogenic claudication: M48.061

## 2021-03-03 HISTORY — DX: Benign prostatic hyperplasia without lower urinary tract symptoms: N40.0

## 2021-03-03 HISTORY — DX: Unspecified glaucoma: H40.9

## 2021-03-03 NOTE — Patient Instructions (Addendum)
Your procedure is scheduled on: Tuesday 03/14/21 Report to the Registration Desk on the 1st floor of the [facility]. To find out your arrival time, please call [phone] between 1PM - 3PM on: Monday 03/13/21  REMEMBER: Instructions that are not followed completely may result in serious medical risk, up to and including death; or upon the discretion of your surgeon and anesthesiologist your surgery may need to be rescheduled.  Do not eat food or drinks after midnight the night before surgery.  No gum chewing, lozengers or hard candies.   TAKE THESE MEDICATIONS THE MORNING OF SURGERY WITH A SIP OF WATER: amLODipine (NORVASC) 10 MG tablet  Follow recommendations from Surgeon's regarding stopping Aspirin.  Hold JARDIANCE 10 MG TABS tablet for 3 days.  One week prior to surgery: Stop Anti-inflammatories (NSAIDS) such as Advil, Aleve, Ibuprofen, Motrin, Naproxen, Naprosyn and Aspirin based products such as Excedrin, Goodys Powder, BC Powder. Stop taking your Multiple Vitamin (MULTI-VITAMINS) TABS and ANY OVER THE COUNTER supplements until after surgery. You may however, continue to take Tylenol if needed for pain up until the day of surgery.  No Alcohol for 24 hours before or after surgery.  No Smoking including e-cigarettes for 24 hours prior to surgery.  No chewable tobacco products for at least 6 hours prior to surgery.  No nicotine patches on the day of surgery.  Do not use any "recreational" drugs for at least a week prior to your surgery.  Please be advised that the combination of cocaine and anesthesia may have negative outcomes, up to and including death. If you test positive for cocaine, your surgery will be cancelled.  On the morning of surgery brush your teeth with toothpaste and water, you may rinse your mouth with mouthwash if you wish. Do not swallow any toothpaste or mouthwash.  Do not wear jewelry.  Do not wear lotions, powders, or cologne.   Do not shave body  from the neck down 48 hours prior to surgery just in case you cut yourself which could leave a site for infection.   dentures may not be worn into surgery.  Do not bring valuables to the hospital. [facility] is not responsible for any missing/lost belongings or valuables.   Notify your doctor if there is any change in your medical condition (cold, fever, infection).  Wear comfortable clothing (specific to your surgery type) to the hospital.  After surgery, you can help prevent lung complications by doing breathing exercises.  Take deep breaths and cough every 1-2 hours.   If you are being discharged the day of surgery, you will not be allowed to drive home. You will need a responsible adult (18 years or older) to drive you home and stay with you that night.   If you are taking public transportation, you will need to have a responsible adult (18 years or older) with you. Please confirm with your physician that it is acceptable to use public transportation.   Please call the [place] Dept[facility] at [phone] if you have any questions about these instructions.  Surgery Visitation Policy:  Patients undergoing a surgery or procedure may have one family member or support person with them as long as that person is not COVID-19 positive or experiencing its symptoms.  That person may remain in the waiting area during the procedure and may rotate out with other people.  Inpatient Visitation:    Visiting hours are 7 a.m. to 8 p.m. Up to two visitors ages 2+ are allowed  at one time in a patient room. The visitors may rotate out with other people during the day. Visitors must check out when they leave, or other visitors will not be allowed. One designated support person may remain overnight. The visitor must pass COVID-19 screenings, use hand sanitizer when entering and exiting the patient's room and wear a mask at all times, including in the patient's room. Patients must also wear a  mask when staff or their visitor are in the room. Masking is required regardless of vaccination status.

## 2021-03-06 ENCOUNTER — Other Ambulatory Visit: Payer: Self-pay

## 2021-03-06 ENCOUNTER — Other Ambulatory Visit: Payer: Medicare Other

## 2021-03-06 ENCOUNTER — Other Ambulatory Visit
Admission: RE | Admit: 2021-03-06 | Discharge: 2021-03-06 | Disposition: A | Discharge status: Home / Self Care | Payer: Medicare Other | Source: Ambulatory Visit | Attending: Urology | Admitting: Urology

## 2021-03-06 DIAGNOSIS — R310 Gross hematuria: Secondary | ICD-10-CM

## 2021-03-06 DIAGNOSIS — I129 Hypertensive chronic kidney disease with stage 1 through stage 4 chronic kidney disease, or unspecified chronic kidney disease: Secondary | ICD-10-CM | POA: Diagnosis present

## 2021-03-06 DIAGNOSIS — N1831 Chronic kidney disease, stage 3a: Secondary | ICD-10-CM | POA: Insufficient documentation

## 2021-03-06 DIAGNOSIS — I10 Essential (primary) hypertension: Secondary | ICD-10-CM

## 2021-03-06 DIAGNOSIS — Z01818 Encounter for other preprocedural examination: Secondary | ICD-10-CM

## 2021-03-06 LAB — URINALYSIS, COMPLETE
Bilirubin, UA: NEGATIVE
Ketones, UA: NEGATIVE
Leukocytes,UA: NEGATIVE
Nitrite, UA: NEGATIVE
Specific Gravity, UA: 1.015 (ref 1.005–1.030)
Urobilinogen, Ur: 0.2 mg/dL (ref 0.2–1.0)
pH, UA: 7 (ref 5.0–7.5)

## 2021-03-06 LAB — POTASSIUM: Potassium: 3.9 mmol/L (ref 3.5–5.1)

## 2021-03-06 LAB — MICROSCOPIC EXAMINATION
Bacteria, UA: NONE SEEN
RBC, Urine: >30 /hpf — AB (ref 0–2)

## 2021-03-08 ENCOUNTER — Other Ambulatory Visit: Payer: Self-pay | Admitting: Urology

## 2021-03-08 LAB — CULTURE, URINE COMPREHENSIVE

## 2021-03-08 MED ORDER — SULFAMETHOXAZOLE-TRIMETHOPRIM 800-160 MG PO TABS: 1.0000 | ORAL_TABLET | Freq: Two times a day (BID) | ORAL | 0 refills | Status: AC

## 2021-03-14 ENCOUNTER — Encounter
Admission: RE | Disposition: A | Discharge status: Home / Self Care | Payer: Self-pay | Source: Home / Self Care | Attending: Urology

## 2021-03-14 ENCOUNTER — Ambulatory Visit: Payer: Medicare Other | Admitting: Urgent Care

## 2021-03-14 ENCOUNTER — Ambulatory Visit: Payer: Medicare Other

## 2021-03-14 ENCOUNTER — Other Ambulatory Visit: Payer: Self-pay

## 2021-03-14 ENCOUNTER — Encounter: Payer: Self-pay | Admitting: Urology

## 2021-03-14 ENCOUNTER — Ambulatory Visit
Admission: RE | Admit: 2021-03-14 | Discharge: 2021-03-14 | Disposition: A | Discharge status: Home / Self Care | Payer: Medicare Other | Attending: Urology | Admitting: Urology

## 2021-03-14 DIAGNOSIS — E119 Type 2 diabetes mellitus without complications: Secondary | ICD-10-CM | POA: Insufficient documentation

## 2021-03-14 DIAGNOSIS — R1900 Intra-abdominal and pelvic swelling, mass and lump, unspecified site: Secondary | ICD-10-CM | POA: Insufficient documentation

## 2021-03-14 DIAGNOSIS — N400 Enlarged prostate without lower urinary tract symptoms: Secondary | ICD-10-CM | POA: Insufficient documentation

## 2021-03-14 DIAGNOSIS — I10 Essential (primary) hypertension: Secondary | ICD-10-CM | POA: Insufficient documentation

## 2021-03-14 DIAGNOSIS — R310 Gross hematuria: Secondary | ICD-10-CM | POA: Insufficient documentation

## 2021-03-14 DIAGNOSIS — N2889 Other specified disorders of kidney and ureter: Secondary | ICD-10-CM

## 2021-03-14 DIAGNOSIS — R319 Hematuria, unspecified: Secondary | ICD-10-CM

## 2021-03-14 HISTORY — PX: CYSTOSCOPY W/ RETROGRADES: SHX1426

## 2021-03-14 HISTORY — DX: Chronic kidney disease, unspecified: N18.9

## 2021-03-14 HISTORY — PX: URETEROSCOPY: SHX842

## 2021-03-14 LAB — GLUCOSE, CAPILLARY
Glucose-Capillary: 218 mg/dL — ABNORMAL HIGH (ref 70–99)
Glucose-Capillary: 205 mg/dL — ABNORMAL HIGH (ref 70–99)

## 2021-03-14 SURGERY — CYSTOSCOPY, WITH RETROGRADE PYELOGRAM
Anesthesia: General | Laterality: Right

## 2021-03-14 MED ORDER — HYDROMORPHONE HCL 1 MG/ML IJ SOLN: 0.2500 mg | INTRAMUSCULAR | Status: DC | PRN

## 2021-03-14 MED ORDER — ACETAMINOPHEN 160 MG/5ML PO SOLN
325.0000 mg | ORAL | Status: DC | PRN
  Filled 2021-03-14: qty 20.3

## 2021-03-14 MED ORDER — PROMETHAZINE HCL 25 MG/ML IJ SOLN: 12.5000 mg | Freq: Once | INTRAMUSCULAR | Status: DC | PRN

## 2021-03-14 MED ORDER — PROPOFOL 10 MG/ML IV BOLUS
INTRAVENOUS | Status: DC | PRN
  Administered 2021-03-14: 100 mg via INTRAVENOUS
  Administered 2021-03-14: 50 mg via INTRAVENOUS

## 2021-03-14 MED ORDER — SODIUM CHLORIDE 0.9 % IV SOLN: INTRAVENOUS | Status: DC

## 2021-03-14 MED ORDER — FENTANYL CITRATE (PF) 100 MCG/2ML IJ SOLN
INTRAMUSCULAR | Status: DC | PRN
  Administered 2021-03-14 (×2): 50 ug via INTRAVENOUS

## 2021-03-14 MED ORDER — LACTATED RINGERS IV SOLN: INTRAVENOUS | Status: DC

## 2021-03-14 MED ORDER — OXYCODONE HCL 5 MG PO TABS: 5.0000 mg | ORAL_TABLET | Freq: Once | ORAL | Status: DC | PRN

## 2021-03-14 MED ORDER — ROCURONIUM BROMIDE 100 MG/10ML IV SOLN
INTRAVENOUS | Status: DC | PRN
  Administered 2021-03-14: 10 mg via INTRAVENOUS
  Administered 2021-03-14: 30 mg via INTRAVENOUS

## 2021-03-14 MED ORDER — MEPERIDINE HCL 25 MG/ML IJ SOLN: 6.2500 mg | INTRAMUSCULAR | Status: DC | PRN

## 2021-03-14 MED ORDER — ORAL CARE MOUTH RINSE: 15.0000 mL | Freq: Once | OROMUCOSAL | Status: AC

## 2021-03-14 MED ORDER — SUGAMMADEX SODIUM 200 MG/2ML IV SOLN
INTRAVENOUS | Status: DC | PRN
  Administered 2021-03-14: 200 mg via INTRAVENOUS

## 2021-03-14 MED ORDER — ACETAMINOPHEN 10 MG/ML IV SOLN: 1000.0000 mg | Freq: Once | INTRAVENOUS | Status: DC | PRN

## 2021-03-14 MED ORDER — LIDOCAINE HCL (CARDIAC) PF 100 MG/5ML IV SOSY
PREFILLED_SYRINGE | INTRAVENOUS | Status: DC | PRN
  Administered 2021-03-14: 100 mg via INTRAVENOUS

## 2021-03-14 MED ORDER — CEFAZOLIN SODIUM-DEXTROSE 2-4 GM/100ML-% IV SOLN
2.0000 g | INTRAVENOUS | Status: AC
  Administered 2021-03-14: 2 g via INTRAVENOUS

## 2021-03-14 MED ORDER — EPHEDRINE SULFATE 50 MG/ML IJ SOLN
INTRAMUSCULAR | Status: DC | PRN
  Administered 2021-03-14: 10 mg via INTRAVENOUS
  Administered 2021-03-14: 15 mg via INTRAVENOUS

## 2021-03-14 MED ORDER — OXYCODONE HCL 5 MG/5ML PO SOLN: 5.0000 mg | Freq: Once | ORAL | Status: DC | PRN

## 2021-03-14 MED ORDER — FAMOTIDINE 20 MG PO TABS
20.0000 mg | ORAL_TABLET | Freq: Once | ORAL | Status: AC
  Administered 2021-03-14: 20 mg via ORAL

## 2021-03-14 MED ORDER — CHLORHEXIDINE GLUCONATE 0.12 % MT SOLN
15.0000 mL | Freq: Once | OROMUCOSAL | Status: AC
  Administered 2021-03-14: 15 mL via OROMUCOSAL

## 2021-03-14 MED ORDER — ACETAMINOPHEN 325 MG PO TABS: 325.0000 mg | ORAL_TABLET | ORAL | Status: DC | PRN

## 2021-03-14 MED ORDER — ONDANSETRON HCL 4 MG/2ML IJ SOLN
INTRAMUSCULAR | Status: DC | PRN
  Administered 2021-03-14: 4 mg via INTRAVENOUS

## 2021-03-14 SURGICAL SUPPLY — 49 items
ADAPTER IRRIG TUBE 2 SPIKE SOL (ADAPTER) ×3 IMPLANT
ADPR TBG 2 SPK PMP STRL ASCP (ADAPTER) ×2
BAG DRAIN CYSTO-URO LG1000N (MISCELLANEOUS) ×3 IMPLANT
BAG DRN LRG CPC RND TRDRP CNTR (MISCELLANEOUS) ×2
BAG URO DRAIN 4000ML (MISCELLANEOUS) ×3 IMPLANT
BRUSH SCRUB EZ  4% CHG (MISCELLANEOUS) ×1
BRUSH SCRUB EZ 1% IODOPHOR (MISCELLANEOUS) ×3 IMPLANT
BRUSH SCRUB EZ 4% CHG (MISCELLANEOUS) ×2 IMPLANT
CATH FOL 2WAY LX 24X30 (CATHETERS) IMPLANT
CATH FOL LX CONE TIP  8F (CATHETERS) ×1
CATH FOL LX CONE TIP 8F (CATHETERS) ×2 IMPLANT
CATH URET FLEX-TIP 2 LUMEN 10F (CATHETERS) ×3 IMPLANT
CATH URETL OPEN 5X70 (CATHETERS) ×3 IMPLANT
CNTNR SPEC 2.5X3XGRAD LEK (MISCELLANEOUS) ×2
CONRAY 43 FOR UROLOGY 50M (MISCELLANEOUS) ×3 IMPLANT
CONT SPEC 4OZ STER OR WHT (MISCELLANEOUS) ×1
CONT SPEC 4OZ STRL OR WHT (MISCELLANEOUS) ×2
CONTAINER SPEC 2.5X3XGRAD LEK (MISCELLANEOUS) ×2 IMPLANT
DRAPE UTILITY 15X26 TOWEL STRL (DRAPES) ×3 IMPLANT
ELECT LOOP 22F BIPOLAR SML (ELECTROSURGICAL)
ELECTRODE LOOP 22F BIPOLAR SML (ELECTROSURGICAL) IMPLANT
GAUZE 4X4 16PLY ~~LOC~~+RFID DBL (SPONGE) ×6 IMPLANT
GLOVE SURG UNDER POLY LF SZ7.5 (GLOVE) ×3 IMPLANT
GOWN STRL REUS W/ TWL LRG LVL3 (GOWN DISPOSABLE) ×4 IMPLANT
GOWN STRL REUS W/ TWL XL LVL3 (GOWN DISPOSABLE) ×2 IMPLANT
GOWN STRL REUS W/TWL LRG LVL3 (GOWN DISPOSABLE) ×6
GOWN STRL REUS W/TWL XL LVL3 (GOWN DISPOSABLE) ×3
GUIDEWIRE GREEN .038 145CM (MISCELLANEOUS) ×3 IMPLANT
GUIDEWIRE STR DUAL SENSOR (WIRE) ×9 IMPLANT
HOLDER FOLEY CATH W/STRAP (MISCELLANEOUS) ×3 IMPLANT
INFUSOR MANOMETER BAG 3000ML (MISCELLANEOUS) ×3 IMPLANT
IV NS IRRIG 3000ML ARTHROMATIC (IV SOLUTION) ×18 IMPLANT
KIT TURNOVER CYSTO (KITS) ×3 IMPLANT
LOOP CUT BIPOLAR 24F LRG (ELECTROSURGICAL) IMPLANT
MANIFOLD NEPTUNE II (INSTRUMENTS) IMPLANT
PACK CYSTO AR (MISCELLANEOUS) ×3 IMPLANT
SET CYSTO W/LG BORE CLAMP LF (SET/KITS/TRAYS/PACK) ×3 IMPLANT
SET IRRIG Y TYPE TUR BLADDER L (SET/KITS/TRAYS/PACK) ×3 IMPLANT
SET IRRIGATING DISP (SET/KITS/TRAYS/PACK) ×3 IMPLANT
SHEATH URETERAL 12FRX35CM (MISCELLANEOUS) ×3 IMPLANT
STENT URET 6FRX26 CONTOUR (STENTS) ×3 IMPLANT
SURGILUBE 2OZ TUBE FLIPTOP (MISCELLANEOUS) ×3 IMPLANT
SYR 20ML LL LF (SYRINGE) ×3 IMPLANT
SYR TOOMEY 50ML (SYRINGE) ×3 IMPLANT
SYR TOOMEY IRRIG 70ML (MISCELLANEOUS) ×3
SYRINGE TOOMEY IRRIG 70ML (MISCELLANEOUS) ×2 IMPLANT
VALVE UROSEAL ADJ ENDO (VALVE) ×3 IMPLANT
WATER STERILE IRR 1000ML POUR (IV SOLUTION) ×3 IMPLANT
WATER STERILE IRR 500ML POUR (IV SOLUTION) ×3 IMPLANT

## 2021-03-14 NOTE — Discharge Instructions (Addendum)
DISCHARGE INSTRUCTIONS FOR URETEROSCPY/URETERAL STENT   MEDICATIONS:  1. Resume all your other meds from home.  2.  AZO (over-the-counter) can help with the burning/stinging when you urinate.   ACTIVITY:  1. May resume regular activities in 24 hours. 2. No driving while on narcotic pain medications  3. Drink plenty of water  4. Continue to walk at home - you can still get blood clots when you are at home, so keep active, but don't over do it.  5. May return to work/school tomorrow or when you feel ready    SIGNS/SYMPTOMS TO CALL:  Common postoperative symptoms include urinary frequency, urgency, bladder spasm and blood in the urine  Please call us if you have a fever greater than 101.5, uncontrolled nausea/vomiting, uncontrolled pain, dizziness, unable to urinate, excessively bloody urine, chest pain, shortness of breath, leg swelling, leg pain, or any other concerns or questions.   You can reach [place] at [phone].   FOLLOW-UP:  1. You we will be contacted with your lab results hopefully later this week or early next week  [place]   The drugs that you were given will stay in your system until tomorrow so for the next 24 hours you should not:  Drive an automobile Make any legal decisions Drink any alcoholic beverage   You may resume regular meals tomorrow.  Today it is better to start with liquids and gradually work up to solid foods.  You may eat anything you prefer, but it is better to start with liquids, then soup and crackers, and gradually work up to solid foods.   Please notify your doctor immediately if you have any unusual bleeding, trouble breathing, redness and pain at the surgery site, drainage, fever, or pain not relieved by medication.    Additional Instructions:     Please contact your physician with any problems or Same Day Surgery at [phone], Monday through Friday 6 am to 4 pm, or [place] at [facility]  number at [phone].

## 2021-03-14 NOTE — Anesthesia Procedure Notes (Signed)
Procedure Name: Intubation Date/Time: 03/14/2021 11:46 AM Performed by: Aline Brochure, CRNA Pre-anesthesia Checklist: Patient identified, Patient being monitored, Timeout performed, Emergency Drugs available and Suction available Patient Re-evaluated:Patient Re-evaluated prior to induction Oxygen Delivery Method: Circle system utilized Preoxygenation: Pre-oxygenation with 100% oxygen Induction Type: IV induction Ventilation: Mask ventilation without difficulty Laryngoscope Size: McGraph and 4 Grade View: Grade I Tube type: Oral Tube size: 7.5 mm Number of attempts: 1 Airway Equipment and Method: Stylet Placement Confirmation: ETT inserted through vocal cords under direct vision, positive ETCO2 and breath sounds checked- equal and bilateral Secured at: 21 cm Tube secured with: Tape Dental Injury: Teeth and Oropharynx as per pre-operative assessment

## 2021-03-14 NOTE — Op Note (Signed)
Preoperative diagnosis:  Gross hematuria  Postoperative diagnosis:  Lateralizing hematuria right UO Right renal pelvic mass  Procedure: Cystoscopy with right retrograde pyelogram Right ureteroscopy  Surgeon: Abbie Sons, MD  Anesthesia: General  Complications: None  Intraoperative findings:  Cystoscopy: Urethra normal in caliber without stricture.  Prostate with moderate lateral lobe enlargement and hypervascularity without active bleeding.  Bladder mucosa without erythema, solid or papillary lesions.  UOs normal position.  Bloody efflux seen from the right UO; clear efflux left Right ureteroscopy: Normal-appearing ureter without mucosal abnormalities.  Narrowing of the proximal ureter and unable to advance the ureteroscope into the renal pelvis Right retrograde pyelogram large filling defect right renal pelvis and midpole calyces, infundibuli.  Ureter normal in appearance  EBL: Minimal  Specimens:  Saline washing right renal pelvis for cytology  Indication: Timothy Duke is a 87 y.o. patient with gross hematuria initially evaluated December 2021.  MR urogram performed due to abnormal GFR which showed no abnormalities.  Cystoscopy remarkable for BPH with hypervascularity.  It was thought the most likely etiology of his hematuria was prostatic in origin.  He was started on finasteride however had persistent hematuria.  Repeat cystoscopy showed no evidence of prostatic bleeding however urine that was grossly bloody.  Cystoscopy under anesthesia and bilateral retrograde pyelograms were recommended and if no upper tract abnormalities are identified proceed with transurethral vaporization of the prostate.  After reviewing the management options for treatment, he elected to proceed with the above surgical procedure(s). We have discussed the potential benefits and risks of the procedure, side effects of the proposed treatment, the likelihood of the patient achieving the goals of the  procedure, and any potential problems that might occur during the procedure or recuperation. Informed consent has been obtained.  Description of procedure:  The patient was taken to the operating room and general anesthesia was induced.  The patient was placed in the dorsal lithotomy position, prepped and draped in the usual sterile fashion, and preoperative antibiotics were administered. A preoperative time-out was performed.   A 21 French cystoscope was lubricated, passed per urethra and advanced proximally under direct vision with findings as described above.  An 8 [place] cone-tip catheter was placed through the cystoscope and positioned at the right UO.  Contrast was instilled with findings as described above.  A 0.038 Sensor wire was placed through the cystoscope and advanced to the renal pelvis under fluoroscopic guidance.  A 4.5 French semirigid ureteroscope was then passed per urethra.  The right ureter orifice was easily engaged alongside the guidewire and the ureteroscope was advanced to the proximal ureter with no significant findings noted as described above.  The semirigid scope was removed and a single channel digital ureteroscope was passed per urethra.  The distal ureter was engaged with ureteroscope however would not advance any further.  A Super Stiff wire was placed through the scope and the ureteroscope was advanced over the Super Stiff wire to the proximal ureter.  The flexible ureteroscope would not advance beyond the UPJ.  The semirigid ureteroscope was removed.  A 5 French open-ended ureteral catheter was placed over the Sensor wire to the renal pelvis.  Saline barbotage was then performed which was sent for cytology.  A 21F/26 cm Contour ureteral stent was then placed over the guidewire under fluoroscopic guidance.  Proximal curl was noted in the upper renal pelvis and distal curl in the bladder under fluoroscopy.  The bladder was emptied with the cystoscope  sheath.  After anesthetic  reversal he was transported to the PACU in stable condition.  Plan: Intraoperative findings suspicious for urothelial carcinoma.  If urine cytology is positive follow-up will be scheduled for discussion management options If cytology shows no evidence of malignancy will discuss second look ureteroscopy after a period of stent dilation   Abbie Sons, M.D.

## 2021-03-14 NOTE — Progress Notes (Signed)
03/15/2021 4:33 PM   Timothy Duke 11-Dec-1932 [id number]  Referring provider: Rusty Aus, MD [facility] [address] [place],  [place] [address]  Chief Complaint  Patient presents with   Post-op Follow-up    Urological history 1. High risk hematuria -non-smoker -MRI 2021 - Multiple bilateral renal cysts, some of which are mildly irregular and or hemorrhagic, benign (Bosniak I-II). No enhancing renal lesions.  Prostatomegaly, suggesting BPH. -cysto 03/2020 - Marked BPH with hypervascularity which could be a source of his hematuria -urine cytology 2021 - negative -cysto/TURP/BRTG's 02/2021  2. BPH with LU TS -PSA 1.75 in 06/2020 -s/p cysto with right retrograde pyelogram with right ureteroscopy   HPI: Timothy Duke is a 87 y.o. male who presents today for post-op follow up with his daughter, Timothy Duke.    His post operative course has been as expected and uneventful.    He is experiencing stent discomfort symptoms of frequency, urgency, dysuria, urge incontinence and gross hematuria.  Patient denies any modifying or aggravating factors. Patient denies any fevers, chills, nausea or vomiting.      PMH: Past Medical History:  Diagnosis Date   BPH (benign prostatic hyperplasia)    CRI (chronic renal insufficiency)    Degenerative lumbar spinal stenosis    Diabetes mellitus without complication ([place])    Glaucoma    Hypertension     Surgical History: Past Surgical History:  Procedure Laterality Date   APPENDECTOMY     CYSTOSCOPY W/ RETROGRADES Right 03/14/2021   Procedure: CYSTOSCOPY WITH RIGHT SIDE RETROGRADE PYELOGRAM;  Surgeon: Abbie Sons, MD;  Location: [facility] ORS;  Service: Urology;  Laterality: Right;   EYE SURGERY     KNEE SURGERY     SHOULDER SURGERY  1996   TRIGGER FINGER RELEASE Left    URETEROSCOPY Right 03/14/2021   Procedure: URETEROSCOPY WITH STENT PLACEMENT;  Surgeon: Abbie Sons, MD;  Location: [facility] ORS;   Service: Urology;  Laterality: Right;    Home Medications:  Allergies as of 03/15/2021   No Known Allergies      Medication List        Accurate as of March 15, 2021 11:59 PM. If you have any questions, ask your nurse or doctor.          STOP taking these medications    olmesartan-hydrochlorothiazide 40-12.5 MG tablet Commonly known as: BENICAR HCT Stopped by: Ioane Bhola, PA-C       TAKE these medications    Acetaminophen 500 MG capsule Take 500 mg by mouth daily.   amLODipine 10 MG tablet Commonly known as: NORVASC Take 10 mg by mouth daily.   aspirin EC 81 MG tablet Take 81 mg by mouth daily.   finasteride 5 MG tablet Commonly known as: PROSCAR Take 1 tablet (5 mg total) by mouth daily.   glipiZIDE 10 MG 24 hr tablet Commonly known as: GLUCOTROL XL Take 10 mg by mouth daily with breakfast.   hydrALAZINE 50 MG tablet Commonly known as: APRESOLINE Take 50 mg by mouth 2 (two) times daily.   Jardiance 10 MG Tabs tablet Generic drug: empagliflozin Take 10 mg by mouth daily.   latanoprost 0.005 % ophthalmic solution Commonly known as: XALATAN Place 1 drop into both eyes at bedtime.   lisinopril-hydrochlorothiazide 20-12.5 MG tablet Commonly known as: ZESTORETIC Take 1 tablet by mouth daily.   mirabegron ER 25 MG Tb24 tablet Commonly known as: MYRBETRIQ Take 1 tablet (25 mg total) by mouth daily.  Started by: Zara Council, PA-C   Multi-Vitamins Tabs Take 1 tablet by mouth daily.   glucose blood test strip USE AS DIRECTED TWICE DAILY   Prodigy No Coding Blood Gluc test strip Generic drug: glucose blood 2 (two) times daily. as directed   Prodigy Twist Top Lancets 28G Misc USE TO CHECK BLOOD SUGAR TWICE DAILY AS INSTRUCTED   sulfamethoxazole-trimethoprim 800-160 MG tablet Commonly known as: BACTRIM DS Take 1 tablet by mouth 2 (two) times daily for 7 days.   timolol 0.5 % ophthalmic solution Commonly known as: TIMOPTIC Place 1  drop into both eyes 2 (two) times daily.   Tradjenta 5 MG Tabs tablet Generic drug: linagliptin Take 5 mg by mouth daily.        Allergies: No Known Allergies  Family History: No family history on file.  Social History:  reports that he has never smoked. His smokeless tobacco use includes chew. He reports that he does not drink alcohol and does not use drugs.  ROS: Pertinent ROS in HPI  Physical Exam: BP (!) 174/68   Pulse (!) 52   Ht 6' (1.829 m)   Wt 223 lb (101.2 kg)   BMI 30.24 kg/m   Constitutional:  Well nourished. Alert and oriented, No acute distress. HEENT: [place] AT, mask in place.  Trachea midline Cardiovascular: No clubbing, cyanosis, or edema. Respiratory: Normal respiratory effort, no increased work of breathing. Neurologic: Grossly intact, no focal deficits, moving all 4 extremities. Psychiatric: Normal mood and affect.  Laboratory Data: N/A  Pertinent Imaging: N/A  Assessment & Plan:    1. BPH with LU TS -s/p cystoscopy with the right retrograde pyelogram and right ureteroscopy of noting moderate lateral lobe enlargement and hypervascularity -He will continue finasteride 5 mg daily  2. High risk hematuria -right filling defect noted during retrograde -urine cytology sent of right renal pelvis washings -stent placed for passive dilation of ureter for possible second look  -Myrbetriq 25 mg daily, # 28 samples given for stent discomfort symptoms  -reviewed red flag signs   Return for Pending urine cytology results.  These notes generated with voice recognition software. I apologize for typographical errors.  Zara Council, PA-C  [facility] [address]  [place], [place] [address] [phone]

## 2021-03-14 NOTE — Interval H&P Note (Signed)
History and Physical Interval Note:  CV:RRR Lungs:clear  03/14/2021 11:24 AM  Timothy Duke  has presented today for surgery, with the diagnosis of BPH, gross hematuria.  The various methods of treatment have been discussed with the patient and family. After consideration of risks, benefits and other options for treatment, the patient has consented to  Procedure(s): TRANSURETHRAL RESECTION OF THE PROSTATE (TURP) with Button (N/A) CYSTOSCOPY WITH RETROGRADE PYELOGRAM (Bilateral) URETEROSCOPY (Bilateral) as a surgical intervention.  The patient's history has been reviewed, patient examined, no change in status, stable for surgery.  I have reviewed the patient's chart and labs.  Questions were answered to the patient's satisfaction.     [place]

## 2021-03-14 NOTE — Anesthesia Preprocedure Evaluation (Addendum)
Anesthesia Evaluation  Patient identified by MRN, date of birth, ID band Patient awake    Reviewed: Allergy & Precautions, NPO status , Patient's Chart, lab work & pertinent test results  History of Anesthesia Complications Negative for: history of anesthetic complications  Airway Mallampati: II  TM Distance: <3 FB Neck ROM: Full    Dental  (+) Upper Dentures, Lower Dentures   Pulmonary neg pulmonary ROS,    Pulmonary exam normal        Cardiovascular hypertension, Normal cardiovascular exam     Neuro/Psych   Spinal stenosis  Glaucoma  negative psych ROS   GI/Hepatic negative GI ROS, Neg liver ROS,   Endo/Other  diabetes, Type 2  Renal/GU CRFRenal disease     Gross hematuria, BPH    Musculoskeletal negative musculoskeletal ROS (+)   Abdominal   Peds  Hematology negative hematology ROS (+)   Anesthesia Other Findings EKG 03/06/21 Sinus bradycardia, 50 bpm Left axis deviation Left ventricular hypertrophy with QRS widening and repolarization abnormality ( Cornell product )  Reproductive/Obstetrics                            Anesthesia Physical Anesthesia Plan  ASA: 3  Anesthesia Plan: General   Post-op Pain Management:    Induction:   PONV Risk Score and Plan:   Airway Management Planned:   Additional Equipment:   Intra-op Plan:   Post-operative Plan:   Informed Consent: I have reviewed the patients History and Physical, chart, labs and discussed the procedure including the risks, benefits and alternatives for the proposed anesthesia with the patient or authorized representative who has indicated his/her understanding and acceptance.       Plan Discussed with:   Anesthesia Plan Comments: (Check airway - may need McGrath)       Anesthesia Quick Evaluation

## 2021-03-14 NOTE — Transfer of Care (Signed)
Immediate Anesthesia Transfer of Care Note  Patient: Timothy Duke  Procedure(s) Performed: CYSTOSCOPY WITH RIGHT SIDE RETROGRADE PYELOGRAM (Right) URETEROSCOPY WITH STENT PLACEMENT (Right)  Patient Location: PACU  Anesthesia Type:General  Level of Consciousness: awake  Airway & Oxygen Therapy: Patient Spontanous Breathing and Patient connected to nasal cannula oxygen  Post-op Assessment: Report given to RN and Post -op Vital signs reviewed and stable  Post vital signs: Reviewed and stable  Last Vitals:  Vitals Value Taken Time  BP 165/71 03/14/21 1258  Temp    Pulse 56 03/14/21 1300  Resp 12 03/14/21 1300  SpO2 97 % 03/14/21 1300  Vitals shown include unvalidated device data.  Last Pain:  Vitals:   03/14/21 1003  TempSrc: Oral  PainSc: 0-No pain         Complications: No notable events documented.

## 2021-03-15 ENCOUNTER — Ambulatory Visit (INDEPENDENT_AMBULATORY_CARE_PROVIDER_SITE_OTHER): Payer: Medicare Other | Admitting: Urology

## 2021-03-15 ENCOUNTER — Encounter: Payer: Self-pay | Admitting: Urology

## 2021-03-15 VITALS — BP 174/68 | HR 52 | Ht 72.0 in | Wt 223.0 lb

## 2021-03-15 DIAGNOSIS — N400 Enlarged prostate without lower urinary tract symptoms: Secondary | ICD-10-CM

## 2021-03-15 DIAGNOSIS — R3915 Urgency of urination: Secondary | ICD-10-CM | POA: Diagnosis not present

## 2021-03-15 DIAGNOSIS — R310 Gross hematuria: Secondary | ICD-10-CM

## 2021-03-15 LAB — URINE CULTURE: Culture: NO GROWTH

## 2021-03-15 MED ORDER — MIRABEGRON ER 25 MG PO TB24: 25.0000 mg | ORAL_TABLET | Freq: Every day | ORAL | 0 refills | Status: DC

## 2021-03-15 NOTE — Anesthesia Postprocedure Evaluation (Signed)
Anesthesia Post Note  Patient: Timothy Duke  Procedure(s) Performed: CYSTOSCOPY WITH RIGHT SIDE RETROGRADE PYELOGRAM (Right) URETEROSCOPY WITH STENT PLACEMENT (Right)  Patient location during evaluation: PACU Anesthesia Type: General Level of consciousness: awake and alert Pain management: pain level controlled Vital Signs Assessment: post-procedure vital signs reviewed and stable Respiratory status: spontaneous breathing, nonlabored ventilation, respiratory function stable and patient connected to nasal cannula oxygen Cardiovascular status: blood pressure returned to baseline and stable Postop Assessment: no apparent nausea or vomiting Anesthetic complications: no   No notable events documented.   Last Vitals:  Vitals:   03/14/21 1342 03/14/21 1345  BP: (!) 182/71   Pulse: 72 (!) 48  Resp: 16 (!) 21  Temp: (!) 36.1 C   SpO2: 97% 98%    Last Pain:  Vitals:   03/14/21 1342  TempSrc: Temporal  PainSc: 0-No pain                 [place]

## 2021-03-23 ENCOUNTER — Telehealth: Payer: Self-pay

## 2021-03-23 ENCOUNTER — Other Ambulatory Visit: Payer: Self-pay | Admitting: *Deleted

## 2021-03-23 DIAGNOSIS — D4111 Neoplasm of uncertain behavior of right renal pelvis: Secondary | ICD-10-CM

## 2021-03-23 NOTE — Telephone Encounter (Signed)
Patient's daughter calls triage line she states that she has been expecting a call with patient's urine cytology results. I do not see results under labs, unsure if specimen was sent out from OR? Please advise.

## 2021-03-23 NOTE — Telephone Encounter (Signed)
I contacted the patient's daughter, Aura Fey regarding the urine cytology.  Apparently the urine was sent for culture and not cytology.  With the stent in place he may be sloughing cells from the mass into voiding urine and recommend he do successive voids into a specimen cup and will send for voided cytology.  Was instructed to keep the specimen refrigerated while he is collecting the urine.  His daughter will bring in the specimen tomorrow for cytology.  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  We discussed standard option of nephroureterectomy however based on age and comorbidities would recommend referral to [facility] to see if he is felt to be a candidate for laser ablation and Jelmyto.  Referral was placed

## 2021-03-24 ENCOUNTER — Other Ambulatory Visit: Payer: Medicare Other

## 2021-03-24 ENCOUNTER — Other Ambulatory Visit: Payer: Self-pay

## 2021-03-24 DIAGNOSIS — D4111 Neoplasm of uncertain behavior of right renal pelvis: Secondary | ICD-10-CM

## 2021-03-27 LAB — CYTOLOGY - NON PAP

## 2021-05-16 DIAGNOSIS — C689 Malignant neoplasm of urinary organ, unspecified: Secondary | ICD-10-CM | POA: Insufficient documentation

## 2021-06-12 ENCOUNTER — Ambulatory Visit (INDEPENDENT_AMBULATORY_CARE_PROVIDER_SITE_OTHER): Payer: Medicare Other | Admitting: Podiatry

## 2021-06-12 ENCOUNTER — Encounter: Payer: Self-pay | Admitting: Podiatry

## 2021-06-12 ENCOUNTER — Other Ambulatory Visit: Payer: Self-pay

## 2021-06-12 DIAGNOSIS — N1831 Chronic kidney disease, stage 3a: Secondary | ICD-10-CM

## 2021-06-12 DIAGNOSIS — B351 Tinea unguium: Secondary | ICD-10-CM

## 2021-06-12 DIAGNOSIS — E1142 Type 2 diabetes mellitus with diabetic polyneuropathy: Secondary | ICD-10-CM | POA: Diagnosis not present

## 2021-06-12 DIAGNOSIS — M205X2 Other deformities of toe(s) (acquired), left foot: Secondary | ICD-10-CM | POA: Diagnosis not present

## 2021-06-12 DIAGNOSIS — M79674 Pain in right toe(s): Secondary | ICD-10-CM

## 2021-06-12 DIAGNOSIS — M79675 Pain in left toe(s): Secondary | ICD-10-CM

## 2021-06-12 NOTE — Progress Notes (Signed)
This patient returns to my office for at risk foot care.  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  This patient is unable to cut nails himself since the patient cannot reach his nails.These nails are painful walking and wearing shoes.  This patient presents for at risk foot care today.  General Appearance  Alert, conversant and in no acute stress.  Vascular  Dorsalis pedis and posterior tibial  pulses are palpable  bilaterally.  Capillary return is within normal limits  bilaterally. Temperature is within normal limits  bilaterally.  Neurologic  Senn-Weinstein monofilament wire test diminished  bilaterally. Muscle power within normal limits bilaterally.  Nails Thick disfigured discolored nails with subungual debris  from hallux to fifth toes bilaterally. No evidence of bacterial infection or drainage bilaterally.  Orthopedic  No limitations of motion  feet .  No crepitus or effusions noted.  No bony pathology or digital deformities noted.Hallux limitus 1st MPJ  B/L.  Skin  normotropic skin with no porokeratosis noted bilaterally.  No signs of infections or ulcers noted.     Onychomycosis  Pain in right toes  Pain in left toes  Consent was obtained for treatment procedures.   Mechanical debridement of nails 1-5  bilaterally performed with a nail nipper.  Filed with dremel without incident. 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   Return office visit    3 months                  Told patient to return for periodic foot care and evaluation due to potential at risk complications.   Gardiner Barefoot DPM

## 2021-06-21 DIAGNOSIS — C649 Malignant neoplasm of unspecified kidney, except renal pelvis: Secondary | ICD-10-CM | POA: Insufficient documentation

## 2021-06-23 ENCOUNTER — Ambulatory Visit: Payer: Medicare Other

## 2021-06-23 ENCOUNTER — Other Ambulatory Visit: Payer: Self-pay

## 2021-06-23 DIAGNOSIS — E1142 Type 2 diabetes mellitus with diabetic polyneuropathy: Secondary | ICD-10-CM

## 2021-06-23 DIAGNOSIS — M205X2 Other deformities of toe(s) (acquired), left foot: Secondary | ICD-10-CM

## 2021-06-23 DIAGNOSIS — M205X1 Other deformities of toe(s) (acquired), right foot: Secondary | ICD-10-CM

## 2021-06-23 NOTE — Progress Notes (Signed)
SITUATION ?Reason for Consult: Evaluation for Prefabricated Diabetic Shoes and Custom Diabetic Inserts. ?Patient / Caregiver Report: Patient would like well fitting shoes ? ?OBJECTIVE DATA: ?Patient History / Diagnosis:  ?  ICD-10-CM   ?1. Diabetic polyneuropathy associated with type 2 diabetes mellitus ([place])  E11.42   ?  ?2. Hallux limitus of left foot  M20.5X2   ?  ?3. Hallux limitus of right foot  M20.5X1   ?  ? ? ?Current or Previous Devices:   Current user - Apex B3000M ? ?In-Person Foot Examination: ?Ulcers & Callousing:   None ? ?Deformities:   ?- Hallux rigidus ?  ?Shoe Size: 11W ? ?ORTHOTIC RECOMMENDATION ?Recommended Devices: ?- 1x pair prefabricated PDAC approved diabetic shoes; Patient Selected - Orthofeet 520 Size 11W ?- 3x pair custom-to-patient PDAC approved vacuum formed diabetic insoles. ? ?GOALS OF SHOES AND INSOLES ?- Reduce shear and pressure ?- Reduce / Prevent callus formation ?- Reduce / Prevent ulceration ?- Protect the fragile healing compromised diabetic foot. ? ?Patient would benefit from diabetic shoes and inserts as patient has diabetes mellitus and the patient has one or more of the following conditions: ?- Peripheral neuropathy with evidence of callus formation ?- Foot deformity ?- Poor circulation ? ?ACTIONS PERFORMED ?Patient was casted for insoles via crush box and measured for shoes via brannock device. Procedure was explained and patient tolerated procedure well. All questions were answered and concerns addressed. ? ?PLAN ?Patient is to be contacted and scheduled for fitting once CMN is obtained from treaing physician and shoes and insoles have been fabricated and received. ? ?

## 2021-07-14 ENCOUNTER — Telehealth: Payer: Self-pay | Admitting: *Deleted

## 2021-07-14 NOTE — Telephone Encounter (Signed)
Pt calling asking if he still needs to take finasteride? Please advise ?

## 2021-07-17 ENCOUNTER — Encounter: Payer: Self-pay | Admitting: *Deleted

## 2021-07-17 ENCOUNTER — Other Ambulatory Visit: Payer: Self-pay | Admitting: *Deleted

## 2021-07-17 MED ORDER — FINASTERIDE 5 MG PO TABS: 5.0000 mg | ORAL_TABLET | Freq: Every day | ORAL | 2 refills | Status: DC

## 2021-07-17 NOTE — Telephone Encounter (Signed)
Finasteride is used to treat prostate enlargement.  He can stop the medication however 4-6 months after stopping his prostate will enlarge by 30% and he could potentially have worsening urinary symptoms. ?

## 2021-08-03 ENCOUNTER — Telehealth: Payer: Self-pay

## 2021-08-03 NOTE — Telephone Encounter (Signed)
CMN Received - Shoes ordered and foot orthotics released from fabrication hold ?

## 2021-08-03 NOTE — Telephone Encounter (Signed)
Called to let patient know the selected shoe is no longer available in the color he wanted. Patient's daughter said ordering the shoe in black is fine. ?

## 2021-08-31 ENCOUNTER — Telehealth: Payer: Self-pay

## 2021-08-31 NOTE — Telephone Encounter (Signed)
Patient's shoes and insoles are ready. Left vm for patient to return call and schedule appointment.

## 2021-09-14 ENCOUNTER — Encounter: Payer: Self-pay | Admitting: Podiatry

## 2021-09-14 ENCOUNTER — Ambulatory Visit (INDEPENDENT_AMBULATORY_CARE_PROVIDER_SITE_OTHER): Payer: Medicare Other | Admitting: Podiatry

## 2021-09-14 DIAGNOSIS — M205X2 Other deformities of toe(s) (acquired), left foot: Secondary | ICD-10-CM

## 2021-09-14 DIAGNOSIS — B351 Tinea unguium: Secondary | ICD-10-CM

## 2021-09-14 DIAGNOSIS — M79674 Pain in right toe(s): Secondary | ICD-10-CM

## 2021-09-14 DIAGNOSIS — M79675 Pain in left toe(s): Secondary | ICD-10-CM

## 2021-09-14 DIAGNOSIS — E1142 Type 2 diabetes mellitus with diabetic polyneuropathy: Secondary | ICD-10-CM

## 2021-09-14 DIAGNOSIS — N1831 Chronic kidney disease, stage 3a: Secondary | ICD-10-CM

## 2021-09-14 DIAGNOSIS — M205X1 Other deformities of toe(s) (acquired), right foot: Secondary | ICD-10-CM

## 2021-09-14 NOTE — Progress Notes (Signed)
This patient returns to my office for at risk foot care.  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  This patient is unable to cut nails himself since the patient cannot reach his nails.These nails are painful walking and wearing shoes.  This patient presents for at risk foot care today.  General Appearance  Alert, conversant and in no acute stress.  Vascular  Dorsalis pedis and posterior tibial  pulses are palpable  bilaterally.  Capillary return is within normal limits  bilaterally. Temperature is within normal limits  bilaterally.  Neurologic  Senn-Weinstein monofilament wire test diminished  bilaterally. Muscle power within normal limits bilaterally.  Nails Thick disfigured discolored nails with subungual debris  from hallux to fifth toes bilaterally. No evidence of bacterial infection or drainage bilaterally.  Orthopedic  No limitations of motion  feet .  No crepitus or effusions noted.  No bony pathology or digital deformities noted.  Hallux limitus  1st MPJ  B/L.  Skin  normotropic skin with no porokeratosis noted bilaterally.  No signs of infections or ulcers noted.     Onychomycosis  Pain in right toes  Pain in left toes  Consent was obtained for treatment procedures.   Mechanical debridement of nails 1-5  bilaterally performed with a nail nipper.  Filed with dremel without incident.    Return office visit   12  weeks                  Told patient to return for periodic foot care and evaluation due to potential at risk complications.   Zykera Abella DPM  

## 2021-09-15 ENCOUNTER — Ambulatory Visit: Payer: Medicare Other

## 2021-09-15 DIAGNOSIS — M205X2 Other deformities of toe(s) (acquired), left foot: Secondary | ICD-10-CM

## 2021-09-15 DIAGNOSIS — E1142 Type 2 diabetes mellitus with diabetic polyneuropathy: Secondary | ICD-10-CM

## 2021-09-15 DIAGNOSIS — M205X1 Other deformities of toe(s) (acquired), right foot: Secondary | ICD-10-CM

## 2021-09-15 NOTE — Progress Notes (Signed)
SITUATION Reason for Visit: Fitting of Diabetic Shoes & Insoles Patient / Caregiver Report:  Patient is satisfied with fit and function of shoes and insoles.  OBJECTIVE DATA: Patient History / Diagnosis:     ICD-10-CM   1. Diabetic polyneuropathy associated with type 2 diabetes mellitus (HCC)  E11.42     2. Hallux limitus of left foot  M20.5X2     3. Hallux limitus of right foot  M20.5X1       Change in Status:   None  ACTIONS PERFORMED: In-Person Delivery, patient was fit with: - 1x pair [id number] PDAC approved prefabricated Diabetic Shoes: Orthofeet [address] 17M - 3x pair [id number] PDAC approved vacuum formed custom diabetic insoles; [employer]: [id number]  Shoes and insoles were verified for structural integrity and safety. Patient wore shoes and insoles in office. Skin was inspected and free of areas of concern after wearing shoes and inserts. Shoes and inserts fit properly. Patient / Caregiver provided with ferbal instruction and demonstration regarding donning, doffing, wear, care, proper fit, function, purpose, cleaning, and use of shoes and insoles ' and in all related precautions and risks and benefits regarding shoes and insoles. Patient / Caregiver was instructed to wear properly fitting socks with shoes at all times. Patient was also provided with verbal instruction regarding how to report any failures or malfunctions of shoes or inserts, and necessary follow up care. Patient / Caregiver was also instructed to contact physician regarding change in status that may affect function of shoes and inserts.   Patient / Caregiver verbalized undersatnding of instruction provided. Patient / Caregiver demonstrated independence with proper donning and doffing of shoes and inserts.  PLAN Patient to follow with treating physician as recommended. Plan of care was discussed with and agreed upon by patient and/or caregiver. All questions were answered and concerns addressed.

## 2021-10-12 ENCOUNTER — Ambulatory Visit: Payer: Self-pay | Admitting: Physician Assistant

## 2021-12-14 ENCOUNTER — Encounter: Payer: Self-pay | Admitting: Podiatry

## 2021-12-14 ENCOUNTER — Ambulatory Visit (INDEPENDENT_AMBULATORY_CARE_PROVIDER_SITE_OTHER): Payer: Medicare Other | Admitting: Podiatry

## 2021-12-14 DIAGNOSIS — M205X2 Other deformities of toe(s) (acquired), left foot: Secondary | ICD-10-CM

## 2021-12-14 DIAGNOSIS — M79674 Pain in right toe(s): Secondary | ICD-10-CM

## 2021-12-14 DIAGNOSIS — E1142 Type 2 diabetes mellitus with diabetic polyneuropathy: Secondary | ICD-10-CM

## 2021-12-14 DIAGNOSIS — M79675 Pain in left toe(s): Secondary | ICD-10-CM | POA: Diagnosis not present

## 2021-12-14 DIAGNOSIS — B351 Tinea unguium: Secondary | ICD-10-CM

## 2021-12-14 DIAGNOSIS — N1831 Chronic kidney disease, stage 3a: Secondary | ICD-10-CM | POA: Diagnosis not present

## 2021-12-14 DIAGNOSIS — M205X1 Other deformities of toe(s) (acquired), right foot: Secondary | ICD-10-CM

## 2021-12-14 NOTE — Progress Notes (Signed)
This patient returns to my office for at risk foot care.  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  This patient is unable to cut nails himself since the patient cannot reach his nails.These nails are painful walking and wearing shoes.  This patient presents for at risk foot care today.  General Appearance  Alert, conversant and in no acute stress.  Vascular  Dorsalis pedis and posterior tibial  pulses are palpable  bilaterally.  Capillary return is within normal limits  bilaterally. Temperature is within normal limits  bilaterally.  Neurologic  Senn-Weinstein monofilament wire test diminished  bilaterally. Muscle power within normal limits bilaterally.  Nails Thick disfigured discolored nails with subungual debris  from hallux to fifth toes bilaterally. No evidence of bacterial infection or drainage bilaterally.  Orthopedic  No limitations of motion  feet .  No crepitus or effusions noted.  No bony pathology or digital deformities noted.  Hallux limitus  1st MPJ  B/L.  Skin  normotropic skin with no porokeratosis noted bilaterally.  No signs of infections or ulcers noted.     Onychomycosis  Pain in right toes  Pain in left toes  Consent was obtained for treatment procedures.   Mechanical debridement of nails 1-5  bilaterally performed with a nail nipper.  Filed with dremel without incident.    Return office visit   12  weeks                  Told patient to return for periodic foot care and evaluation due to potential at risk complications.   Gardiner Barefoot DPM

## 2022-03-22 ENCOUNTER — Encounter: Payer: Self-pay | Admitting: Podiatry

## 2022-03-22 ENCOUNTER — Ambulatory Visit (INDEPENDENT_AMBULATORY_CARE_PROVIDER_SITE_OTHER): Payer: Medicare Other | Admitting: Podiatry

## 2022-03-22 VITALS — BP 213/95 | HR 62

## 2022-03-22 DIAGNOSIS — M79674 Pain in right toe(s): Secondary | ICD-10-CM

## 2022-03-22 DIAGNOSIS — M205X1 Other deformities of toe(s) (acquired), right foot: Secondary | ICD-10-CM

## 2022-03-22 DIAGNOSIS — M79675 Pain in left toe(s): Secondary | ICD-10-CM

## 2022-03-22 DIAGNOSIS — M205X2 Other deformities of toe(s) (acquired), left foot: Secondary | ICD-10-CM | POA: Diagnosis not present

## 2022-03-22 DIAGNOSIS — B351 Tinea unguium: Secondary | ICD-10-CM | POA: Diagnosis not present

## 2022-03-22 DIAGNOSIS — N1831 Chronic kidney disease, stage 3a: Secondary | ICD-10-CM

## 2022-03-22 DIAGNOSIS — E1142 Type 2 diabetes mellitus with diabetic polyneuropathy: Secondary | ICD-10-CM

## 2022-03-22 NOTE — Progress Notes (Addendum)
This patient returns to my office for at risk foot care.  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  This patient is unable to cut nails himself since the patient cannot reach his nails.These nails are painful walking and wearing shoes.  This patient presents for at risk foot care today.  General Appearance  Alert, conversant and in no acute stress.  Vascular  Dorsalis pedis and posterior tibial  pulses are palpable  bilaterally.  Capillary return is within normal limits  bilaterally. Temperature is within normal limits  bilaterally.  Neurologic  Senn-Weinstein monofilament wire test diminished  bilaterally. Muscle power within normal limits bilaterally.  Nails Thick disfigured discolored nails with subungual debris  from hallux to fifth toes bilaterally. No evidence of bacterial infection or drainage bilaterally.  Orthopedic  No limitations of motion  feet .  No crepitus or effusions noted.  No bony pathology or digital deformities noted.  Hallux limitus  1st MPJ  B/L.  Skin  normotropic skin with no porokeratosis noted bilaterally.  No signs of infections or ulcers noted.     Onychomycosis  Pain in right toes  Pain in left toes  Consent was obtained for treatment procedures.   Mechanical debridement of nails 1-5  bilaterally performed with a nail nipper.  Filed with dremel without incident. Blood pressure taken twice and was extremely high.  Patient was alerted and told to follow up with his doctor.   Return office visit   12  weeks                  Told patient to return for periodic foot care and evaluation due to potential at risk complications.   Gardiner Barefoot DPM

## 2022-07-05 ENCOUNTER — Ambulatory Visit: Payer: Medicare Other | Admitting: Podiatry

## 2022-08-16 ENCOUNTER — Ambulatory Visit: Payer: Medicare Other | Admitting: Podiatry

## 2022-08-31 ENCOUNTER — Ambulatory Visit: Payer: Medicare Other | Admitting: Podiatry

## 2022-09-11 ENCOUNTER — Ambulatory Visit (INDEPENDENT_AMBULATORY_CARE_PROVIDER_SITE_OTHER): Payer: Medicare Other | Admitting: Podiatry

## 2022-09-11 ENCOUNTER — Encounter: Payer: Self-pay | Admitting: Podiatry

## 2022-09-11 DIAGNOSIS — M79675 Pain in left toe(s): Secondary | ICD-10-CM

## 2022-09-11 DIAGNOSIS — M79674 Pain in right toe(s): Secondary | ICD-10-CM | POA: Diagnosis not present

## 2022-09-11 DIAGNOSIS — E1142 Type 2 diabetes mellitus with diabetic polyneuropathy: Secondary | ICD-10-CM | POA: Diagnosis not present

## 2022-09-11 DIAGNOSIS — B351 Tinea unguium: Secondary | ICD-10-CM

## 2022-09-11 NOTE — Progress Notes (Signed)
This patient returns to my office for at risk foot care.  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  This patient is unable to cut nails himself since the patient cannot reach his nails.These nails are painful walking and wearing shoes.  This patient presents for at risk foot care today.  General Appearance  Alert, conversant and in no acute stress.  Vascular  Dorsalis pedis and posterior tibial  pulses are palpable  bilaterally.  Capillary return is within normal limits  bilaterally. Temperature is within normal limits  bilaterally.  Neurologic  Senn-Weinstein monofilament wire test diminished  bilaterally. Muscle power within normal limits bilaterally.  Nails Thick disfigured discolored nails with subungual debris  from hallux to fifth toes bilaterally. No evidence of bacterial infection or drainage bilaterally.  Orthopedic  No limitations of motion  feet .  No crepitus or effusions noted.  No bony pathology or digital deformities noted.  Hallux limitus  1st MPJ  B/L.  Skin  normotropic skin with no porokeratosis noted bilaterally.  No signs of infections or ulcers noted.     Onychomycosis  Pain in right toes  Pain in left toes  Consent was obtained for treatment procedures.   Mechanical debridement of nails 1-5  bilaterally performed with a nail nipper.  Filed with dremel without incident. Blood pressure taken twice and was extremely high.  Patient was alerted and told to follow up with his doctor.   Return office visit   12  weeks                  Told patient to return for periodic foot care and evaluation due to potential at risk complications.   Nicholes Rough DPM

## 2022-09-21 ENCOUNTER — Ambulatory Visit (INDEPENDENT_AMBULATORY_CARE_PROVIDER_SITE_OTHER): Payer: Medicare Other | Admitting: Podiatry

## 2022-09-21 DIAGNOSIS — E1142 Type 2 diabetes mellitus with diabetic polyneuropathy: Secondary | ICD-10-CM

## 2022-09-21 DIAGNOSIS — M205X1 Other deformities of toe(s) (acquired), right foot: Secondary | ICD-10-CM

## 2022-09-21 DIAGNOSIS — M205X2 Other deformities of toe(s) (acquired), left foot: Secondary | ICD-10-CM

## 2022-09-21 NOTE — Progress Notes (Signed)
Patient presents today to measured for diabetic shoes and insoles.  Patient was measured for 1 pair of diabetic shoes and 3 pairs of foam casted diabetic insoles.   Ht 5'10 Wt 224 Shoe size  11 w Shoe type 585 or 510  Treating physician dr mark Hyacinth Meeker   Re-appointment for regularly scheduled diabetic foot care visits or if they should experience any trouble with the shoes or insoles.

## 2022-11-23 ENCOUNTER — Ambulatory Visit: Payer: Medicare Other

## 2022-11-23 NOTE — Progress Notes (Signed)
Patient will wait for second pair of shoes / velcro to come in with inserts  Patient did not want tie shoe  Timothy Duke CPed, CFo, CFm

## 2022-12-07 ENCOUNTER — Other Ambulatory Visit: Payer: Medicare Other

## 2022-12-13 ENCOUNTER — Ambulatory Visit: Payer: Medicare Other | Admitting: Podiatry

## 2022-12-13 ENCOUNTER — Encounter: Payer: Self-pay | Admitting: Podiatry

## 2022-12-13 DIAGNOSIS — B351 Tinea unguium: Secondary | ICD-10-CM

## 2022-12-13 DIAGNOSIS — M79675 Pain in left toe(s): Secondary | ICD-10-CM

## 2022-12-13 DIAGNOSIS — E1142 Type 2 diabetes mellitus with diabetic polyneuropathy: Secondary | ICD-10-CM | POA: Diagnosis not present

## 2022-12-13 DIAGNOSIS — M205X2 Other deformities of toe(s) (acquired), left foot: Secondary | ICD-10-CM

## 2022-12-13 DIAGNOSIS — M79674 Pain in right toe(s): Secondary | ICD-10-CM

## 2022-12-13 DIAGNOSIS — N184 Chronic kidney disease, stage 4 (severe): Secondary | ICD-10-CM | POA: Insufficient documentation

## 2022-12-13 DIAGNOSIS — M205X1 Other deformities of toe(s) (acquired), right foot: Secondary | ICD-10-CM

## 2022-12-16 NOTE — Progress Notes (Signed)
  Subjective:  Patient ID: Timothy Duke, male    DOB: 1932/05/24,  MRN: [id number]  Timothy Duke presents to clinic today for: at risk foot care with history of diabetic neuropathy and painful elongated mycotic toenails 1-5 bilaterally which are tender when wearing enclosed shoe gear. Pain is relieved with periodic professional debridement.  Patient is also here to pick up diabetic shoes on today's visit. Chief Complaint  Patient presents with   Nail Problem    DFC,Referring Provider Danella Penton, MD,lov:08/24,A1C:7.0      PCP is Danella Penton, MD.  No Known Allergies  Review of Systems: Negative except as noted in the HPI.  Objective: No changes noted in today's physical examination. There were no vitals filed for this visit.  Timothy Duke is a pleasant 89 y.o. male in NAD. AAO x 3.  Vascular Examination: Capillary refill time <3 seconds b/l LE. Palpable pedal pulses b/l LE. Digital hair diminished b/l. No pedal edema b/l. Skin temperature gradient WNL b/l. No varicosities b/l. Marland Kitchen  Dermatological Examination: Pedal skin with normal turgor, texture and tone b/l. No open wounds. No interdigital macerations b/l. Toenails 1-5 b/l thickened, discolored, dystrophic with subungual debris. There is pain on palpation to dorsal aspect of nailplates.  Neurological Examination: Protective sensation diminished with 10g monofilament b/l.  Musculoskeletal Examination: Muscle strength 5/5 to all lower extremity muscle groups bilaterally. Limited joint ROM to the 1st MPJ b/l.  Assessment/Plan: 1. Pain due to onychomycosis of toenails of both feet   2. Diabetic polyneuropathy associated with type 2 diabetes mellitus (HCC)     -Consent given for treatment as described below: -Examined patient. -Dispensed one pair diabetic shoes and 3 pair total contact insoles. Shoes were appropriate fit with no heel slippage. Reviewed warranty information and patient signed all paperwork stating patient  received shoes, insert(s)/filler(s), break-in instructions and warranty information. Patient instructed not to wear shoes outside unless completely satisfied. Patient related understanding.  -Continue foot and shoe inspections daily. Monitor blood glucose per PCP/Endocrinologist's recommendations. -Toenails 1-5 b/l were debrided in length and girth with sterile nail nippers without iatrogenic bleeding.  -Patient/POA to call should there be question/concern in the interim.   Return in about 3 months (around 03/15/2023).  Freddie Breech, DPM

## 2022-12-21 NOTE — Progress Notes (Signed)
I added charges today thank you

## 2023-03-22 ENCOUNTER — Ambulatory Visit (INDEPENDENT_AMBULATORY_CARE_PROVIDER_SITE_OTHER): Payer: Medicare Other | Admitting: Podiatry

## 2023-03-22 ENCOUNTER — Encounter: Payer: Self-pay | Admitting: Podiatry

## 2023-03-22 DIAGNOSIS — M79674 Pain in right toe(s): Secondary | ICD-10-CM

## 2023-03-22 DIAGNOSIS — E1142 Type 2 diabetes mellitus with diabetic polyneuropathy: Secondary | ICD-10-CM | POA: Diagnosis not present

## 2023-03-22 DIAGNOSIS — M79675 Pain in left toe(s): Secondary | ICD-10-CM | POA: Diagnosis not present

## 2023-03-22 DIAGNOSIS — B351 Tinea unguium: Secondary | ICD-10-CM | POA: Diagnosis not present

## 2023-03-26 NOTE — Progress Notes (Signed)
ANNUAL DIABETIC FOOT EXAM  Subjective: Timothy Duke presents today for annual diabetic foot exam.  Patient confirms h/o diabetes.  Patient denies any h/o foot wounds.  Patient has been diagnosed with neuropathy.  Timothy Penton, MD is patient's PCP.  Past Medical History:  Diagnosis Date   BPH (benign prostatic hyperplasia)    CRI (chronic renal insufficiency)    Degenerative lumbar spinal stenosis    Diabetes mellitus without complication (HCC)    Glaucoma    Hypertension    Patient Active Problem List   Diagnosis Date Noted   CKD (chronic kidney disease) stage 4, GFR 15-29 ml/min (HCC) 12/13/2022   Severe obesity (BMI 35.0-35.9 with comorbidity) (HCC) 12/13/2022   Malignant neoplasm of kidney (HCC) 06/21/2021   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   Benign microscopic hematuria 07/05/2020   Stage 3a chronic kidney disease (HCC) 02/29/2020   Medicare annual wellness visit, initial 07/24/2017   Type 2 diabetes mellitus with diabetic nephropathy, without long-term current use of insulin (HCC) 07/24/2017   Macroalbuminuric diabetic nephropathy (HCC) 07/17/2016   Benign essential HTN 01/07/2014   Degenerative lumbar spinal stenosis 01/07/2014   Past Surgical History:  Procedure Laterality Date   APPENDECTOMY     CYSTOSCOPY W/ RETROGRADES Right 03/14/2021   Procedure: CYSTOSCOPY WITH RIGHT SIDE RETROGRADE PYELOGRAM;  Surgeon: Riki Altes, MD;  Location: [facility] OR[facility];  Service: Urology;  Laterality: Right;   EYE SURGERY     KNEE SURGERY     SHOULDER SURGERY  1996   TRIGGER FINGER RELEASE Left    URETEROSCOPY Right 03/14/2021   Procedure: URETEROSCOPY WITH STENT PLACEMENT;  Surgeon: Riki Altes, MD;  Location: [facility] OR[facility];  Service: Urology;  Laterality: Right;   Current Outpatient Medications on File Prior to Visit  Medication Sig Dispense Refill   Acetaminophen 500 MG coapsule Take 500 mg by mouth daily.     amLODipine (NORVASC) 10 MG tablet Take by mouth.      aspirin EC 81 MG tablet Take 81 mg by mouth daily.     cloNIDine (CATAPRES) 0.1 MG tablet SMARTSIG:0.5 Tablet(s) By Mouth Every Evening     finasteride (PROSCAR) 5 MG tablet Take 1 tablet (5 mg total) by mouth daily. 90 tablet 2   glipiZIDE (GLUCOTROL XL) 10 MG 24 hr tablet Take 10 mg by mouth daily with breakfast.     glucose blood test strip USE AS DIRECTED TWICE DAILY     hydrALAZINE (APRESOLINE) 50 MG tablet Take 50 mg by mouth 2 (two) times daily.     hydrALAZINE (APRESOLINE) 50 MG tablet Take by mouth.     JARDIANCE 10 MG TABS tablet Take 10 mg by mouth daily.     latanoprost (XALATAN) 0.005 % ophthalmic solution Place 1 drop into both eyes at bedtime.     lisinopril-hydrochlorothiazide (ZESTORETIC) 20-12.5 MG tablet Take 1 tablet by mouth daily.     mirabegron ER (MYRBETRIQ) 25 MG TB24 tablet Take 1 tablet (25 mg total) by mouth daily. 28 tablet 0   Multiple Vitamin (MULTI-VITAMINS) TABS Take 1 tablet by mouth daily.     olmesartan-hydrochlorothiazide (BENICAR HCT) 40-12.5 MG tablet Take 1 tablet by mouth daily.     PRODIGY NO CODING BLOOD GLUC test strip 2 (two) times daily. as directed     PRODIGY TWIST TOP LANCETS 28G MISC USE TO CHECK BLOOD SUGAR TWICE DAILY AS INSTRUCTED     timolol (TIMOPTIC) 0.5 % ophthalmic solution Place 1 drop into both eyes 2 (two)  times daily.     TRADJENTA 5 MG TABS tablet Take 5 mg by mouth daily.     No current facility-administered medications on file prior to visit.    No Known Allergies Social History   Occupational History   Not on file  Tobacco Use   Smoking status: Never   Smokeless tobacco: Current    Types: Chew  Vaping Use   Vaping status: Never Used  Substance and Sexual Activity   Alcohol use: No   Drug use: No   Sexual activity: Not on file   History reviewed. No pertinent family history. Immunization History  Administered Date(s) Administered   Influenza-Unspecified 01/07/2014, 01/22/2017   PFIZER Comirnaty(Gray  Top)Covid-19 Tri-Sucrose Vaccine 05/15/2019, 06/05/2019   PFIZER(Purple Top)SARS-COV-2 Vaccination 05/15/2019, 06/05/2019   Pneumococcal Polysaccharide-23 06/04/2012     Review of Systems: Negative except as noted in the HPI.   Objective: There were no vitals filed for this visit.  Timothy Duke is a pleasant 89 y.o. male in NAD. AAO X 3.  Title   Diabetic Foot Exam - detailed Date & Time: 03/22/2023 11:15 AM Diabetic Foot exam was performed with the following findings: Yes  Visual Foot Exam completed.: Yes  Is there a history of foot ulcer?: No Is there a foot ulcer now?: No Is there swelling?: No Is there elevated skin temperature?: No Is there abnormal foot shape?: No Is there a claw toe deformity?: No Are the toenails long?: Yes Are the toenails thick?: Yes Are the toenails ingrown?: No Is the skin thin, fragile, shiny and hairless?": No Normal Range of Motion?: No (Comment: Limited ROM at ankle joint b/l and 1st MPJ b/l) Is there foot or ankle muscle weakness?: Yes Do you have pain in calf while walking?: No Are the shoes appropriate in style and fit?: Yes Can the patient see the bottom of their feet?: No Pulse Foot Exam completed.: Yes   Right Posterior Tibialis: Present Left posterior Tibialis: Present   Right Dorsalis Pedis: Present Left Dorsalis Pedis: Present     Sensory Foot Exam Completed.: Yes Semmes-Weinstein Monofilament Test "+" means "has sensation" and "-" means "no sensation"  R Foot Test Control: Neg L Foot Test Control: Neg   R Site 1-Great Toe: Neg L Site 1-Great Toe: Neg   R Site 4: Neg L Site 4: Neg   R site 5: Neg L Site 5: Neg  R Site 6: Neg L Site 6: Neg     Image components are not supported.   Image components are not supported. Image components are not supported.  T[place] Right vibratory: diminished Left vibratory: diminished  Comments     ADA Risk Categorization: High Risk  Patient has one or more of the following: Loss of  protective sensation Absent pedal pulses Severe Foot deformity History of foot ulcer  Assessment: 1. Pain due to onychomycosis of toenails of both feet   2. Diabetic polyneuropathy associated with type 2 diabetes mellitus (HCC)     Plan: -Patient was evaluated today. All questions/concerns addressed on today's visit. -Diabetic foot examination performed today. -Patient to continue soft, supportive shoe gear daily. -Toenails 1-5 b/l were debrided in length and girth with sterile nail nippers and dremel without iatrogenic bleeding.  -Patient/POA to call should there be question/concern in the interim. Return in about 3 months (around 06/20/2023).  Freddie Breech, DPM      [facility] LOCATION: [address]  [place], [place] [address]                   Office [phone]   [facility] LOCATION: [address] [place], [place] [address] Office [phone]

## 2023-06-21 ENCOUNTER — Encounter: Payer: Self-pay | Admitting: Podiatry

## 2023-06-21 ENCOUNTER — Ambulatory Visit (INDEPENDENT_AMBULATORY_CARE_PROVIDER_SITE_OTHER): Payer: Medicare Other | Admitting: Podiatry

## 2023-06-21 VITALS — Ht 72.0 in | Wt 223.0 lb

## 2023-06-21 DIAGNOSIS — E1142 Type 2 diabetes mellitus with diabetic polyneuropathy: Secondary | ICD-10-CM | POA: Diagnosis not present

## 2023-06-21 DIAGNOSIS — M79674 Pain in right toe(s): Secondary | ICD-10-CM | POA: Diagnosis not present

## 2023-06-21 DIAGNOSIS — M79675 Pain in left toe(s): Secondary | ICD-10-CM

## 2023-06-21 DIAGNOSIS — B351 Tinea unguium: Secondary | ICD-10-CM

## 2023-06-26 ENCOUNTER — Encounter: Payer: Self-pay | Admitting: Podiatry

## 2023-06-26 NOTE — Progress Notes (Signed)
  Subjective:  Patient ID: Timothy Duke, male    DOB: Jun 27, 1932,  MRN: [id number]  Timothy Duke presents to clinic today for at risk foot care with history of diabetic neuropathy and painful, elongated thickened toenails x 10 which are symptomatic when wearing enclosed shoe gear. This interferes with his/her daily activities.  Chief Complaint  Patient presents with   Nail Problem    Pt is here for [facility] unsure of last A1C PCP is Dr Hyacinth Meeker and LOV was in February.   New problem(s): None.   PCP is Danella Penton, MD.  No Known Allergies  Review of Systems: Negative except as noted in the HPI.  Objective: No changes noted in today's physical examination. There were no vitals filed for this visit. Timothy Duke is a pleasant 89 y.o. male in NAD. AAO x 3.  Vascular Examination: Capillary refill time immediate b/l. Vascular status intact b/l with palpable pedal pulses. Pedal hair present b/l. No pain with calf compression b/l. Skin temperature gradient WNL b/l. No cyanosis or clubbing b/l. No ischemia or gangrene noted b/l.   Neurological Examination: Pt has subjective symptoms of neuropathy. Protective sensation diminished with 10g monofilament b/l. Vibratory sensation diminished b/l.  Dermatological Examination: Pedal skin with normal turgor, texture and tone b/l.  No open wounds. No interdigital macerations.   Toenails 1-5 b/l thick, discolored, elongated with subungual debris and pain on dorsal palpation.   No corns, calluses nor porokeratotic lesions noted.  Musculoskeletal Examination: Muscle strength 5/5 to all lower extremity muscle groups bilaterally. Limited joint ROM to the left ankle, right ankle, and 1st MPJ b/l.  Radiographs: None  Assessment/Plan: 1. Pain due to onychomycosis of toenails of both feet   2. Diabetic polyneuropathy associated with type 2 diabetes mellitus (HCC)   Patient was evaluated and treated. All patient's and/or POA's questions/concerns  addressed on today's visit. Mycotic toenails 1-5 debrided in length and girth without incident.  Continue daily foot inspections and monitor blood glucose per PCP/Endocrinologist's recommendations.Continue soft, supportive shoe gear daily. Report any pedal injuries to medical professional. Call office if there are any quesitons/concerns. -Patient/POA to call should there be question/concern in the interim.   Return in about 3 months (around 09/21/2023).  Freddie Breech, DPM      [place] LOCATION: [address], [place] [address]                   Office [phone]   [facility] LOCATION: [address] [place], [place] [address] Office [phone]

## 2023-08-30 ENCOUNTER — Ambulatory Visit

## 2023-09-13 ENCOUNTER — Ambulatory Visit

## 2023-09-23 ENCOUNTER — Encounter: Payer: Self-pay | Admitting: Podiatry

## 2023-09-23 ENCOUNTER — Ambulatory Visit: Admitting: Podiatry

## 2023-09-23 DIAGNOSIS — E1142 Type 2 diabetes mellitus with diabetic polyneuropathy: Secondary | ICD-10-CM | POA: Diagnosis not present

## 2023-09-23 DIAGNOSIS — M79675 Pain in left toe(s): Secondary | ICD-10-CM | POA: Diagnosis not present

## 2023-09-23 DIAGNOSIS — M79674 Pain in right toe(s): Secondary | ICD-10-CM

## 2023-09-23 DIAGNOSIS — B351 Tinea unguium: Secondary | ICD-10-CM | POA: Diagnosis not present

## 2023-09-23 NOTE — Progress Notes (Signed)
  Subjective:  Patient ID: Timothy Duke, male    DOB: 1933/02/04,  MRN: [id number]  89 y.o. male presents to clinic with  at risk foot care with history of diabetic neuropathy and painful elongated mycotic toenails 1-5 bilaterally which are tender when wearing enclosed shoe gear. Pain is relieved with periodic professional debridement.  Chief Complaint  Patient presents with   Nail Problem    Thick painful toenails, 3 month follow up   New problem(s): None   PCP is Timothy Cunning, MD.  No Known Allergies  Review of Systems: Negative except as noted in the HPI.   Objective:  Timothy Duke is a pleasant 89 y.o. male in NAD. AAO x 3.  Vascular Examination: Vascular status intact b/l with palpable pedal pulses. CFT immediate b/l. No edema. No pain with calf compression b/l. Skin temperature gradient WNL b/l. No ischemia or gangrene noted b/l LE. No cyanosis or clubbing noted b/l LE.  Neurological Examination: Protective sensation diminished with 10g monofilament b/l. Vibratory sensation diminished b/l.  Dermatological Examination: Pedal skin with normal turgor, texture and tone b/l. Toenails 1-5 b/l thick, discolored, elongated with subungual debris and pain on dorsal palpation. No hyperkeratotic lesions noted b/l.   Musculoskeletal Examination: Muscle strength 5/5 to b/l LE. Limited joint ROM to the bilateral ankles, 1st MPJ left foot, and 1st MPJ right foot.  Radiographs: None  Last A1c:       No data to display           Assessment:   1. Pain due to onychomycosis of toenails of both feet   2. Diabetic polyneuropathy associated with type 2 diabetes mellitus (HCC)    Plan:  Consent given for treatment. Patient examined. All patient's and/or POA's questions/concerns addressed on today's visit. Toenails 1-5 debrided in length and girth without incident. Continue foot and shoe inspections daily. Monitor blood glucose per PCP/Endocrinologist's recommendations. Continue soft,  supportive shoe gear daily. Report any pedal injuries to medical professional. Call office if there are any questions/concerns. -Patient/POA to call should there be question/concern in the interim.  Return in about 3 months (around 12/24/2023).  Timothy Duke, DPM      [place] LOCATION: [address], [place] [address]                   Office [phone]   [facility] LOCATION: [address] [place], [place] [address] Office [phone]

## 2023-12-26 ENCOUNTER — Ambulatory Visit: Admitting: Podiatry

## 2023-12-26 ENCOUNTER — Encounter: Payer: Self-pay | Admitting: Podiatry

## 2023-12-26 DIAGNOSIS — M79674 Pain in right toe(s): Secondary | ICD-10-CM | POA: Diagnosis not present

## 2023-12-26 DIAGNOSIS — E1142 Type 2 diabetes mellitus with diabetic polyneuropathy: Secondary | ICD-10-CM | POA: Diagnosis not present

## 2023-12-26 DIAGNOSIS — M79675 Pain in left toe(s): Secondary | ICD-10-CM

## 2023-12-26 DIAGNOSIS — B351 Tinea unguium: Secondary | ICD-10-CM

## 2023-12-29 NOTE — Progress Notes (Signed)
  Subjective:  Patient ID: Timothy Duke, male    DOB: 03-May-1932,  MRN: [id number]  Timothy Duke presents to clinic today for at risk foot care with history of diabetic neuropathy and painful thick toenails that are difficult to trim. Pain interferes with ambulation. Aggravating factors include wearing enclosed shoe gear. Pain is relieved with periodic professional debridement.  Chief Complaint  Patient presents with   [facility]    Rm3 Diabetic foot care/ A1C 7.6/ Dr. Oneil Pinal Last visit December 17, 2023   New problem(s): None.   PCP is Pinal Oneil FALCON, MD.  No Known Allergies  Review of Systems: Negative except as noted in the HPI.  Objective: No changes noted in today's physical examination. There were no vitals filed for this visit. Timothy Duke is a pleasant 89 y.o. male in NAD. AAO x 3.  Vascular Examination: Vascular status intact b/l with palpable pedal pulses. CFT immediate b/l. No edema. No pain with calf compression b/l. Skin temperature gradient WNL b/l. No ischemia or gangrene noted b/l LE. No cyanosis or clubbing noted b/l LE.  Neurological Examination: Protective sensation diminished with 10g monofilament b/l. Vibratory sensation diminished b/l.  Dermatological Examination: Pedal skin with normal turgor, texture and tone b/l. Toenails 1-5 b/l thick, discolored, elongated with subungual debris and pain on dorsal palpation. No hyperkeratotic lesions noted b/l.   Musculoskeletal Examination: Muscle strength 5/5 to b/l LE. Limited joint ROM to the bilateral ankles, 1st MPJ left foot, and 1st MPJ right foot.  Radiographs: None  Assessment/Plan: 1. Pain due to onychomycosis of toenails of both feet   2. Diabetic polyneuropathy associated with type 2 diabetes mellitus (HCC)   Patient was evaluated and treated. All patient's and/or POA's questions/concerns addressed on today's visit. Mycotic toenails 1-5 debrided in length and girth without incident.  Continue daily foot  inspections and monitor blood glucose per PCP/Endocrinologist's recommendations.Continue soft, supportive shoe gear daily. Report any pedal injuries to medical professional. Call office if there are any quesitons/concerns.  Return in about 3 months (around 03/26/2024).  Delon LITTIE Merlin, DPM      [place] LOCATION: [address], [place] [address]                   Office [phone]   [facility] LOCATION: [address] [place], [place] [address] Office [phone]

## 2024-03-26 ENCOUNTER — Ambulatory Visit: Admitting: Podiatry

## 2024-03-26 ENCOUNTER — Encounter: Payer: Self-pay | Admitting: Podiatry

## 2024-03-26 DIAGNOSIS — E1142 Type 2 diabetes mellitus with diabetic polyneuropathy: Secondary | ICD-10-CM

## 2024-03-26 DIAGNOSIS — B351 Tinea unguium: Secondary | ICD-10-CM | POA: Diagnosis not present

## 2024-03-26 DIAGNOSIS — E119 Type 2 diabetes mellitus without complications: Secondary | ICD-10-CM | POA: Diagnosis not present

## 2024-03-26 DIAGNOSIS — M205X1 Other deformities of toe(s) (acquired), right foot: Secondary | ICD-10-CM | POA: Diagnosis not present

## 2024-03-26 DIAGNOSIS — Z0189 Encounter for other specified special examinations: Secondary | ICD-10-CM

## 2024-03-26 DIAGNOSIS — M79674 Pain in right toe(s): Secondary | ICD-10-CM | POA: Diagnosis not present

## 2024-03-26 DIAGNOSIS — M79675 Pain in left toe(s): Secondary | ICD-10-CM | POA: Diagnosis not present

## 2024-03-26 DIAGNOSIS — M205X2 Other deformities of toe(s) (acquired), left foot: Secondary | ICD-10-CM

## 2024-03-26 NOTE — Progress Notes (Signed)
 Subjective:  Patient ID: Timothy Duke, male    DOB: 1932-10-15,  MRN: [id number]  Timothy Duke presents to clinic today for for annual diabetic foot examination, at risk foot care with history of diabetic neuropathy, and painful thick toenails that are difficult to trim. Pain interferes with ambulation. Aggravating factors include wearing enclosed shoe gear. Pain is relieved with periodic professional debridement.  Chief Complaint  Patient presents with   Nail Problem    Thick painful toenails, 3 month follow up    Diabetes   New problem(s): None.   PCP is Timothy Oneil FALCON, MD.  Allergies[1]  Review of Systems: Negative except as noted in the HPI.  Objective: No changes noted in today's physical examination. There were no vitals filed for this visit. Timothy Duke is a pleasant 89 y.o. male in NAD. AAO x 3.   Diabetic foot exam was performed with the following findings:   Vascular Examination: CFT less than 3 seconds. DP pulses palpable b/l. PT pulses nonpalpable b/l. Digital hair absent. Skin temperature gradient warm to warn b/l. No ischemia or gangrene. No cyanosis or clubbing noted b/l. +1 pitting edema bilateral ankles.   Neurological Examination: Sensation grossly intact b/l with 10 gram monofilament. Vibratory sensation decreased b/l.  Dermatological Examination: Pedal skin thin, shiny and atrophic b/l. No open wounds. No interdigital macerations.   Toenails 1-5 b/l thick, discolored, elongated with subungual debris and pain on dorsal palpation.   No corns, calluses, nor porokeratotic lesions.  Musculoskeletal Examination: Muscle strength 5/5 to all lower extremity muscle groups bilaterally. Limited joint ROM to the bilateral ankles and IPJ of bilateral great toes.  Radiographs: None    Assessment/Plan: 1. Pain due to onychomycosis of toenails of both feet   2. Hallux limitus of left foot   3. Hallux limitus of right foot   4. Diabetic polyneuropathy  associated with type 2 diabetes mellitus (HCC)   5. Encounter for diabetic foot exam (HCC)    Orders Placed This Encounter  Procedures   For home use only DME Other see comment    To [facility]: Dispense one pair extra depth shoes and 3 pair heat moldable insoles.    Length of Need:   12 Months   FOR HOME USE ONLY DME OTHER SEE COMMENT Diabetic foot examination performed today. All patient's and/or POA's questions/concerns addressed on today's visit. Toenails 1-5 b/l debrided in length and girth without incident. Continue foot and shoe inspections daily. Monitor blood glucose per PCP/Endocrinologist's recommendations. Continue soft, supportive shoe gear daily. Report any pedal injuries to medical professional. Call office if there are any questions/concerns. -Diabetic foot examination performed today. -Continue diabetic foot care principles: inspect feet daily, monitor glucose as recommended by PCP and/or Endocrinologist, and follow prescribed diet per PCP, Endocrinologist and/or dietician. -Continue diabetic shoes daily. -Rx to be sent to [facility] for one pair diabetic shoes and 3 pair heat moldable insoles. To [facility] [address] [place], [place] [address] Phone: [phone] Fax: [phone]. -Patient/POA to call should there be question/concern in the interim.   Return in about 3 months (around 06/24/2024).  Delon LITTIE Merlin, DPM      [place] LOCATION: [address]  [place], [place] [address]                   Office [phone]   [facility] LOCATION: [address] [place], [place] [address] Office [phone]     [1] No Known Allergies

## 2024-03-27 ENCOUNTER — Emergency Department

## 2024-03-27 ENCOUNTER — Other Ambulatory Visit: Payer: Self-pay

## 2024-03-27 ENCOUNTER — Inpatient Hospital Stay
Admission: EM | Admit: 2024-03-27 | Discharge: 2024-04-01 | Disposition: A | Source: Home / Self Care | Attending: Hospitalist | Admitting: Hospitalist

## 2024-03-27 DIAGNOSIS — E872 Acidosis, unspecified: Secondary | ICD-10-CM | POA: Diagnosis present

## 2024-03-27 DIAGNOSIS — I480 Paroxysmal atrial fibrillation: Secondary | ICD-10-CM | POA: Diagnosis present

## 2024-03-27 DIAGNOSIS — E119 Type 2 diabetes mellitus without complications: Secondary | ICD-10-CM | POA: Diagnosis not present

## 2024-03-27 DIAGNOSIS — Z833 Family history of diabetes mellitus: Secondary | ICD-10-CM | POA: Diagnosis not present

## 2024-03-27 DIAGNOSIS — Z8553 Personal history of malignant neoplasm of renal pelvis: Secondary | ICD-10-CM

## 2024-03-27 DIAGNOSIS — R0602 Shortness of breath: Secondary | ICD-10-CM | POA: Diagnosis present

## 2024-03-27 DIAGNOSIS — N184 Chronic kidney disease, stage 4 (severe): Secondary | ICD-10-CM | POA: Diagnosis present

## 2024-03-27 DIAGNOSIS — I493 Ventricular premature depolarization: Secondary | ICD-10-CM | POA: Diagnosis present

## 2024-03-27 DIAGNOSIS — I5031 Acute diastolic (congestive) heart failure: Secondary | ICD-10-CM | POA: Diagnosis not present

## 2024-03-27 DIAGNOSIS — I2489 Other forms of acute ischemic heart disease: Secondary | ICD-10-CM | POA: Diagnosis present

## 2024-03-27 DIAGNOSIS — Z79899 Other long term (current) drug therapy: Secondary | ICD-10-CM

## 2024-03-27 DIAGNOSIS — Z720 Tobacco use: Secondary | ICD-10-CM

## 2024-03-27 DIAGNOSIS — I130 Hypertensive heart and chronic kidney disease with heart failure and stage 1 through stage 4 chronic kidney disease, or unspecified chronic kidney disease: Principal | ICD-10-CM | POA: Diagnosis present

## 2024-03-27 DIAGNOSIS — N179 Acute kidney failure, unspecified: Secondary | ICD-10-CM | POA: Diagnosis present

## 2024-03-27 DIAGNOSIS — I25118 Atherosclerotic heart disease of native coronary artery with other forms of angina pectoris: Secondary | ICD-10-CM | POA: Diagnosis not present

## 2024-03-27 DIAGNOSIS — Z7982 Long term (current) use of aspirin: Secondary | ICD-10-CM | POA: Diagnosis not present

## 2024-03-27 DIAGNOSIS — I5033 Acute on chronic diastolic (congestive) heart failure: Secondary | ICD-10-CM | POA: Diagnosis present

## 2024-03-27 DIAGNOSIS — E1122 Type 2 diabetes mellitus with diabetic chronic kidney disease: Secondary | ICD-10-CM | POA: Diagnosis present

## 2024-03-27 DIAGNOSIS — Z905 Acquired absence of kidney: Secondary | ICD-10-CM

## 2024-03-27 DIAGNOSIS — Z7985 Long-term (current) use of injectable non-insulin antidiabetic drugs: Secondary | ICD-10-CM | POA: Diagnosis not present

## 2024-03-27 DIAGNOSIS — R7989 Other specified abnormal findings of blood chemistry: Secondary | ICD-10-CM | POA: Diagnosis not present

## 2024-03-27 DIAGNOSIS — H409 Unspecified glaucoma: Secondary | ICD-10-CM | POA: Diagnosis present

## 2024-03-27 DIAGNOSIS — D72829 Elevated white blood cell count, unspecified: Secondary | ICD-10-CM | POA: Diagnosis present

## 2024-03-27 DIAGNOSIS — I132 Hypertensive heart and chronic kidney disease with heart failure and with stage 5 chronic kidney disease, or end stage renal disease: Secondary | ICD-10-CM | POA: Diagnosis not present

## 2024-03-27 DIAGNOSIS — N400 Enlarged prostate without lower urinary tract symptoms: Secondary | ICD-10-CM | POA: Diagnosis present

## 2024-03-27 DIAGNOSIS — Z8249 Family history of ischemic heart disease and other diseases of the circulatory system: Secondary | ICD-10-CM

## 2024-03-27 DIAGNOSIS — I161 Hypertensive emergency: Secondary | ICD-10-CM | POA: Diagnosis present

## 2024-03-27 DIAGNOSIS — Z7984 Long term (current) use of oral hypoglycemic drugs: Secondary | ICD-10-CM | POA: Diagnosis not present

## 2024-03-27 DIAGNOSIS — I160 Hypertensive urgency: Secondary | ICD-10-CM

## 2024-03-27 DIAGNOSIS — Z823 Family history of stroke: Secondary | ICD-10-CM | POA: Diagnosis not present

## 2024-03-27 DIAGNOSIS — I509 Heart failure, unspecified: Secondary | ICD-10-CM | POA: Diagnosis not present

## 2024-03-27 DIAGNOSIS — D631 Anemia in chronic kidney disease: Secondary | ICD-10-CM | POA: Diagnosis present

## 2024-03-27 DIAGNOSIS — Z8679 Personal history of other diseases of the circulatory system: Secondary | ICD-10-CM | POA: Diagnosis not present

## 2024-03-27 DIAGNOSIS — J810 Acute pulmonary edema: Secondary | ICD-10-CM | POA: Diagnosis not present

## 2024-03-27 HISTORY — DX: Paroxysmal atrial fibrillation: I48.0

## 2024-03-27 HISTORY — DX: Malignant neoplasm of urinary organ, unspecified: C68.9

## 2024-03-27 HISTORY — DX: Chronic kidney disease, stage 4 (severe): N18.4

## 2024-03-27 LAB — CBC
HCT: 39.3 % (ref 39.0–52.0)
HCT: 39.9 % (ref 39.0–52.0)
Hemoglobin: 12.9 g/dL — ABNORMAL LOW (ref 13.0–17.0)
Hemoglobin: 13.5 g/dL (ref 13.0–17.0)
MCH: 30.7 pg (ref 26.0–34.0)
MCH: 31.2 pg (ref 26.0–34.0)
MCHC: 32.8 g/dL (ref 30.0–36.0)
MCHC: 33.8 g/dL (ref 30.0–36.0)
MCV: 93.6 fL (ref 80.0–100.0)
MCV: 92.1 fL (ref 80.0–100.0)
Platelets: 364 K/uL (ref 150–400)
Platelets: 357 K/uL (ref 150–400)
RBC: 4.2 MIL/uL — ABNORMAL LOW (ref 4.22–5.81)
RBC: 4.33 MIL/uL (ref 4.22–5.81)
RDW: 13.5 % (ref 11.5–15.5)
RDW: 13.2 % (ref 11.5–15.5)
WBC: 14.7 K/uL — ABNORMAL HIGH (ref 4.0–10.5)
WBC: 16.6 K/uL — ABNORMAL HIGH (ref 4.0–10.5)
nRBC: 0 % (ref 0.0–0.2)
nRBC: 0 % (ref 0.0–0.2)

## 2024-03-27 LAB — BASIC METABOLIC PANEL WITH GFR
Anion gap: 11 (ref 5–15)
BUN: 55 mg/dL — ABNORMAL HIGH (ref 8–23)
CO2: 23 mmol/L (ref 22–32)
Calcium: 9.3 mg/dL (ref 8.9–10.3)
Chloride: 101 mmol/L (ref 98–111)
Creatinine, Ser: 3.24 mg/dL — ABNORMAL HIGH (ref 0.61–1.24)
GFR, Estimated: 18 mL/min — ABNORMAL LOW (ref >60)
Glucose, Bld: 127 mg/dL — ABNORMAL HIGH (ref 70–99)
Potassium: 5 mmol/L (ref 3.5–5.1)
Sodium: 135 mmol/L (ref 135–145)

## 2024-03-27 LAB — PRO BRAIN NATRIURETIC PEPTIDE: Pro Brain Natriuretic Peptide: 15059 pg/mL — ABNORMAL HIGH (ref <300.0)

## 2024-03-27 LAB — TROPONIN T, HIGH SENSITIVITY
Troponin T High Sensitivity: 54 ng/L — ABNORMAL HIGH (ref 0–19)
Troponin T High Sensitivity: 53 ng/L — ABNORMAL HIGH (ref 0–19)

## 2024-03-27 LAB — CREATININE, SERUM
Creatinine, Ser: 3.23 mg/dL — ABNORMAL HIGH (ref 0.61–1.24)
GFR, Estimated: 18 mL/min — ABNORMAL LOW (ref >60)

## 2024-03-27 MED ORDER — SODIUM CHLORIDE 0.9% FLUSH
3.0000 mL | Freq: Two times a day (BID) | INTRAVENOUS | Status: DC
  Administered 2024-03-27 – 2024-04-01 (×10): 3 mL via INTRAVENOUS

## 2024-03-27 MED ORDER — INSULIN ASPART 100 UNIT/ML IJ SOLN
0.0000 [IU] | Freq: Every day | INTRAMUSCULAR | Status: DC
  Administered 2024-03-29: 2 [IU] via SUBCUTANEOUS
  Filled 2024-03-27: qty 2

## 2024-03-27 MED ORDER — FUROSEMIDE 10 MG/ML IJ SOLN
80.0000 mg | Freq: Once | INTRAMUSCULAR | Status: AC
  Administered 2024-03-27: 80 mg via INTRAVENOUS
  Filled 2024-03-27: qty 8

## 2024-03-27 MED ORDER — HYDRALAZINE HCL 20 MG/ML IJ SOLN
10.0000 mg | Freq: Four times a day (QID) | INTRAMUSCULAR | Status: DC | PRN
  Administered 2024-03-28 – 2024-04-01 (×5): 10 mg via INTRAVENOUS
  Filled 2024-03-27 (×6): qty 1

## 2024-03-27 MED ORDER — ACETAMINOPHEN 325 MG PO TABS: 650.0000 mg | ORAL_TABLET | ORAL | Status: DC | PRN

## 2024-03-27 MED ORDER — METOPROLOL SUCCINATE ER 50 MG PO TB24
50.0000 mg | ORAL_TABLET | Freq: Every day | ORAL | Status: DC
  Administered 2024-03-28: 50 mg via ORAL
  Filled 2024-03-27: qty 1

## 2024-03-27 MED ORDER — CLONIDINE HCL 0.1 MG PO TABS
0.1000 mg | ORAL_TABLET | Freq: Every day | ORAL | Status: DC
  Administered 2024-03-28: 0.1 mg via ORAL
  Filled 2024-03-27: qty 1

## 2024-03-27 MED ORDER — ADULT MULTIVITAMIN W/MINERALS CH
1.0000 | ORAL_TABLET | Freq: Every day | ORAL | Status: DC
  Administered 2024-03-28 – 2024-04-01 (×5): 1 via ORAL
  Filled 2024-03-27 (×5): qty 1

## 2024-03-27 MED ORDER — ALPRAZOLAM 0.25 MG PO TABS: 0.2500 mg | ORAL_TABLET | Freq: Two times a day (BID) | ORAL | Status: DC | PRN

## 2024-03-27 MED ORDER — TRAZODONE HCL 50 MG PO TABS
25.0000 mg | ORAL_TABLET | Freq: Every evening | ORAL | Status: DC | PRN
  Administered 2024-03-28: 25 mg via ORAL
  Filled 2024-03-27 (×2): qty 1

## 2024-03-27 MED ORDER — FUROSEMIDE 10 MG/ML IJ SOLN
40.0000 mg | Freq: Two times a day (BID) | INTRAMUSCULAR | Status: DC
  Administered 2024-03-28 – 2024-03-29 (×3): 40 mg via INTRAVENOUS
  Filled 2024-03-27 (×3): qty 4

## 2024-03-27 MED ORDER — ASPIRIN 81 MG PO TBEC
81.0000 mg | DELAYED_RELEASE_TABLET | Freq: Every day | ORAL | Status: DC
  Administered 2024-03-28 – 2024-04-01 (×5): 81 mg via ORAL
  Filled 2024-03-27 (×5): qty 1

## 2024-03-27 MED ORDER — LISINOPRIL-HYDROCHLOROTHIAZIDE 20-12.5 MG PO TABS: 1.0000 | ORAL_TABLET | Freq: Every day | ORAL | Status: DC

## 2024-03-27 MED ORDER — LABETALOL HCL 5 MG/ML IV SOLN
20.0000 mg | INTRAVENOUS | Status: DC | PRN
  Administered 2024-03-27 – 2024-04-01 (×8): 20 mg via INTRAVENOUS
  Filled 2024-03-27 (×11): qty 4

## 2024-03-27 MED ORDER — SODIUM CHLORIDE 0.9 % IV SOLN: 250.0000 mL | INTRAVENOUS | Status: AC | PRN

## 2024-03-27 MED ORDER — ENOXAPARIN SODIUM 30 MG/0.3ML IJ SOSY
30.0000 mg | PREFILLED_SYRINGE | Freq: Every day | INTRAMUSCULAR | Status: DC
  Administered 2024-03-27: 30 mg via SUBCUTANEOUS
  Filled 2024-03-27: qty 0.3

## 2024-03-27 MED ORDER — LATANOPROST 0.005 % OP SOLN
1.0000 [drp] | Freq: Every day | OPHTHALMIC | Status: DC
  Administered 2024-03-29 – 2024-03-31 (×3): 1 [drp] via OPHTHALMIC
  Filled 2024-03-27 (×2): qty 2.5

## 2024-03-27 MED ORDER — ONDANSETRON HCL 4 MG/2ML IJ SOLN: 4.0000 mg | Freq: Four times a day (QID) | INTRAMUSCULAR | Status: DC | PRN

## 2024-03-27 MED ORDER — AMLODIPINE BESYLATE 10 MG PO TABS
10.0000 mg | ORAL_TABLET | Freq: Every day | ORAL | Status: DC
  Administered 2024-03-28 – 2024-04-01 (×5): 10 mg via ORAL
  Filled 2024-03-27 (×3): qty 1
  Filled 2024-03-27: qty 2
  Filled 2024-03-27: qty 1

## 2024-03-27 MED ORDER — GLIPIZIDE ER 10 MG PO TB24
10.0000 mg | ORAL_TABLET | Freq: Every day | ORAL | Status: DC
  Administered 2024-03-28: 10 mg via ORAL
  Filled 2024-03-27: qty 1

## 2024-03-27 MED ORDER — INSULIN ASPART 100 UNIT/ML IJ SOLN
0.0000 [IU] | Freq: Three times a day (TID) | INTRAMUSCULAR | Status: DC
  Administered 2024-03-28: 2 [IU] via SUBCUTANEOUS
  Administered 2024-03-28: 3 [IU] via SUBCUTANEOUS
  Administered 2024-03-29 – 2024-03-30 (×5): 2 [IU] via SUBCUTANEOUS
  Administered 2024-03-30: 18:00:00 3 [IU] via SUBCUTANEOUS
  Administered 2024-03-31: 18:00:00 1 [IU] via SUBCUTANEOUS
  Administered 2024-03-31: 13:00:00 7 [IU] via SUBCUTANEOUS
  Administered 2024-04-01 (×2): 2 [IU] via SUBCUTANEOUS
  Filled 2024-03-27: qty 7
  Filled 2024-03-27: qty 2
  Filled 2024-03-27: qty 3
  Filled 2024-03-27: qty 2
  Filled 2024-03-27: qty 3
  Filled 2024-03-27: qty 2
  Filled 2024-03-27: qty 1
  Filled 2024-03-27 (×5): qty 2

## 2024-03-27 MED ORDER — TIMOLOL MALEATE 0.5 % OP SOLN
1.0000 [drp] | Freq: Two times a day (BID) | OPHTHALMIC | Status: DC
  Administered 2024-03-28 – 2024-04-01 (×9): 1 [drp] via OPHTHALMIC
  Filled 2024-03-27 (×2): qty 5

## 2024-03-27 MED ORDER — SODIUM CHLORIDE 0.9% FLUSH: 3.0000 mL | INTRAVENOUS | Status: DC | PRN

## 2024-03-27 NOTE — Assessment & Plan Note (Addendum)
 From H&P'- The patient will be placed on supplemental coverage with NovoLog . - Will continue Jardiance  and Glucotrol  XL.'  12/16: I discontinued Jardiance  in setting of renal, eGFR less than 20 and mild metabolic acidosis.  Continue insulin  SSI with at bedtime coverage.  Avoid long-acting insulin  at this time given renal function.

## 2024-03-27 NOTE — Assessment & Plan Note (Addendum)
 From H&P'- This could be related to #1 due to renal hypoperfusion. - Will continue diuresis with IV Lasix  and follow BMP. - Will avoid nephrotoxins.'  12/16: Improving, not back to baseline.  Will defer management to nephrology.  Continue strict I's and O's Recheck CMP in the a.m.

## 2024-03-27 NOTE — Assessment & Plan Note (Addendum)
 From H&P'- This is likely secondary to acute new-onset CHF.  I suspect diastolic etiology.  This could be related to hypertensive urgency.  -The patient will be admitted to a cardiac telemetry bed. - We will continue diuresis with IV Lasix .  The patient is already on Jardiance . - Will obtain a 2D echo. - We will follow serial troponins.  Elevated troponin are likely related to his acute CHF and AKI. - We will follow I's and O's and daily weights. - Cardiology consult will be obtained. - I notified [facility] group about the patient.' 12/16: Echo on 12/14: Was read as estimated ejection fraction is 55 to 60%, intermediate diastolic filling due to eating a fusion.  Continue strict I's and O's.

## 2024-03-27 NOTE — Consult Note (Signed)
 PHARMACIST - PHYSICIAN COMMUNICATION  CONCERNING:  Enoxaparin (Lovenox) for DVT Prophylaxis    RECOMMENDATION: Patient was prescribed enoxaprin 40mg  q24 hours for VTE prophylaxis.   Filed Weights   03/27/24 1952  Weight: 103.9 kg (229 lb 0.9 oz)    Body mass index is 32.87 kg/m.  Estimated Creatinine Clearance: 18.7 mL/min (A) (by C-G formula based on SCr of 3.24 mg/dL (H)).   Patient is candidate for enoxaparin 30mg  every 24 hours based on CrCl <65ml/min or Weight <45kg  DESCRIPTION: Pharmacy has adjusted enoxaparin dose per [facility] policy.  Patient is now receiving enoxaparin 30 mg every 24 hours    Annabella LOISE Banks, PharmD Clinical Pharmacist  03/27/2024 7:58 PM

## 2024-03-27 NOTE — ED Triage Notes (Signed)
 Pt to ED via POV from home. Pt reports centralized CP that started last night when he went to lay down. Daughter reports pt was lifting boxes yesterday. Pt also recently diagnosed with bronchitis and on antibiotic and has finished.

## 2024-03-27 NOTE — ED Notes (Signed)
 Pt walked with this RN in hallway and remained 95-98% on RA while walking.

## 2024-03-27 NOTE — Assessment & Plan Note (Addendum)
-   We will continue antihypertensive therapy while holding off nephrotoxins. - He will be placed on as needed IV labetalol  and as needed IV hydralazine . - This is likely contributing to #1.  12/16: Resolved

## 2024-03-27 NOTE — H&P (Addendum)
 Timothy Duke   PATIENT NAME: Timothy Duke    MR#:  [id number]  DATE OF BIRTH:  Sep 26, 1932  DATE OF ADMISSION:  03/27/2024  PRIMARY CARE PHYSICIAN: Timothy Oneil FALCON, MD   Patient is coming from: Home  REQUESTING/REFERRING PHYSICIAN: Willo Dunnings, MD  CHIEF COMPLAINT:   Chief Complaint  Patient presents with   Chest Pain    HISTORY OF PRESENT ILLNESS:  Timothy [facility] is [facility] 89 y.o. male with medical history significant for type II of his mellitus, essential hypertension, glucoma and lumbar spinal stenosis, who presented to the ER with acute onset of worsening dyspnea with associated orthopnea and paroxysmal nocturnal dyspnea since last night.  The patient admitted to worsening lower extremity edema and dyspnea on exertion.  He has been having dry cough and wheezing.  He denied any chest pain or palpitations.  He was diagnosed with acute bronchitis on 12/1.  He denied any nausea or vomiting or abdominal pain.  No dysuria, oliguria or hematuria or flank pain.  ED Course: When he came to the ER, BP was 121/82 with otherwise normal vital signs.  Labs revealed [facility] BUN of 55 with creatinine 3.24 and proBNP 15,059, high-sensitivity troponin was 54 and later 53.  CBC showed leukocytosis 14.7 with hemoglobin 12.9 hematocrit 39.3. EKG as reviewed by me : EKG showed sinus rhythm with [facility] rate of 69 with occasional premature ventricular complexes, left axis deviation and septal Q waves. Imaging: 2 view chest x-ray showed bilateral pulmonary edema with small pleural effusions. Noncontrast head CT scan showed no acute intracranial normalities.  The patient was given 80 mg of IV Lasix.  He will be admitted to [facility] telemetry bed for further evaluation and management. PAST MEDICAL HISTORY:   Past Medical History:  Diagnosis Date   BPH (benign prostatic hyperplasia)    CRI (chronic renal insufficiency)    Degenerative lumbar spinal stenosis    Diabetes mellitus without complication (HCC)    Glaucoma     Hypertension     PAST SURGICAL HISTORY:   Past Surgical History:  Procedure Laterality Date   APPENDECTOMY     CYSTOSCOPY W/ RETROGRADES Right 03/14/2021   Procedure: CYSTOSCOPY WITH RIGHT SIDE RETROGRADE PYELOGRAM;  Surgeon: Timothy Glendia JAYSON, MD;  Location: [facility] OR[facility];  Service: Urology;  Laterality: Right;   EYE SURGERY     KNEE SURGERY     SHOULDER SURGERY  1996   TRIGGER FINGER RELEASE Left    URETEROSCOPY Right 03/14/2021   Procedure: URETEROSCOPY WITH STENT PLACEMENT;  Surgeon: Timothy Glendia JAYSON, MD;  Location: [facility] OR[facility];  Service: Urology;  Laterality: Right;    SOCIAL HISTORY:   Social History   Tobacco Use   Smoking status: Never   Smokeless tobacco: Current    Types: Chew  Substance Use Topics   Alcohol use: No    FAMILY HISTORY:  History reviewed. No pertinent family history.  DRUG ALLERGIES:  Allergies[1]  REVIEW OF SYSTEMS:   ROS As per history of present illness. All pertinent systems were reviewed above. Constitutional, HEENT, cardiovascular, respiratory, GI, GU, musculoskeletal, neuro, psychiatric, endocrine, integumentary and hematologic systems were reviewed and are otherwise negative/unremarkable except for positive findings mentioned above in the HPI.   MEDICATIONS AT HOME:   Prior to Admission medications  Medication Sig Start Date End Date Taking? Authorizing Provider  Acetaminophen  500 MG coapsule Take 500 mg by mouth daily.   Yes [provider]  amLODipine (NORVASC) 10 MG tablet Take by  mouth. 07/15/15  Yes [provider]  aspirin EC 81 MG tablet Take 81 mg by mouth daily.   Yes [provider]  cloNIDine (CATAPRES) 0.1 MG tablet SMARTSIG:0.5 Tablet(s) By Mouth Every Evening 06/02/21  Yes [provider]  furosemide (LASIX) 20 MG tablet Take 20 mg by mouth daily.   Yes [provider]  glipiZIDE (GLUCOTROL XL) 10 MG 24 hr tablet Take 10 mg by mouth daily with breakfast. 02/29/20 03/27/24 Yes  [provider]  latanoprost (XALATAN) 0.005 % ophthalmic solution Place 1 drop into both eyes at bedtime.   Yes [provider]  lisinopril-hydrochlorothiazide (ZESTORETIC) 20-12.5 MG tablet Take 1 tablet by mouth daily. 03/11/21  Yes [provider]  Metoprolol Succinate 50 MG CS24 Take 1 tablet by mouth daily at 12 noon.   Yes [provider]  Multiple Vitamin (MULTI-VITAMINS) TABS Take 1 tablet by mouth daily.   Yes [provider]  OZEMPIC, 1 MG/DOSE, 4 MG/3ML SOPN Inject 1 mg into the skin once [facility] week.   Yes [provider]  timolol (TIMOPTIC) 0.5 % ophthalmic solution Place 1 drop into both eyes 2 (two) times daily. 03/30/14  Yes [provider]  finasteride  (PROSCAR ) 5 MG tablet Take 1 tablet (5 mg total) by mouth daily. Patient not taking: Reported on 03/27/2024 07/17/21   Timothy Glendia BROCKS, MD  glucose blood test strip USE AS DIRECTED TWICE DAILY 05/12/18   [provider]  hydrALAZINE (APRESOLINE) 50 MG tablet Take 50 mg by mouth 2 (two) times daily. Patient not taking: Reported on 03/27/2024 04/11/16   [provider]  JARDIANCE 10 MG TABS tablet Take 10 mg by mouth daily. Patient not taking: Reported on 03/27/2024 02/13/21   [provider]  mirabegron  ER (MYRBETRIQ ) 25 MG TB24 tablet Take 1 tablet (25 mg total) by mouth daily. Patient not taking: Reported on 03/27/2024 03/15/21   Timothy Kirsch A, PA-C  olmesartan-hydrochlorothiazide (BENICAR HCT) 40-12.5 MG tablet Take 1 tablet by mouth daily. Patient not taking: Reported on 03/27/2024 05/16/21   [provider]  PRODIGY NO CODING BLOOD GLUC test strip 2 (two) times daily. as directed 05/13/18   [provider]  PRODIGY TWIST TOP LANCETS 28G MISC USE TO CHECK BLOOD SUGAR TWICE DAILY AS INSTRUCTED 04/17/18   [provider]  TRADJENTA 5 MG TABS tablet Take 5 mg by mouth daily. Patient not taking: Reported on 03/27/2024  09/29/20   [provider]      VITAL SIGNS:  Blood pressure (!) 200/100, pulse 71, temperature 97.9 F (36.6 C), temperature source Oral, resp. rate 15, height 5' 10 (1.778 m), weight 100.5 kg, SpO2 96%.  PHYSICAL EXAMINATION:  Physical Exam  GENERAL:  89 y.o.-year-old patient lying in the bed with no acute distress.  EYES: Pupils equal, round, reactive to light and accommodation. No scleral icterus. Extraocular muscles intact.  HEENT: Head atraumatic, normocephalic. Oropharynx and nasopharynx clear.  NECK:  Supple, no jugular venous distention. No thyroid enlargement, no tenderness.  LUNGS: Diminished bibasilar breath sounds with bibasilar rales. No use of accessory muscles of respiration.  CARDIOVASCULAR: Regular rate and rhythm, S1, S2 normal. No murmurs, rubs, or gallops.  ABDOMEN: Soft, nondistended, nontender. Bowel sounds present. No organomegaly or mass.  EXTREMITIES: 2+ bilateral lower extremity pitting edema with no cyanosis, or clubbing.  NEUROLOGIC: Cranial nerves II through XII are intact. Muscle strength 5/5 in all extremities. Sensation intact. Gait not checked.  PSYCHIATRIC: The patient is alert and oriented  x 3.  Normal affect and good eye contact. SKIN: No obvious rash, lesion, or ulcer.   LABORATORY PANEL:   CBC Recent Labs  Lab 03/27/24 2054  WBC 16.6*  HGB 13.5  HCT 39.9  PLT 357   ------------------------------------------------------------------------------------------------------------------  Chemistries  Recent Labs  Lab 03/27/24 1456 03/27/24 2054  NA 135  --   K 5.0  --   CL 101  --   CO2 23  --   GLUCOSE 127*  --   BUN 55*  --   CREATININE 3.24* 3.23*  CALCIUM 9.3  --    ------------------------------------------------------------------------------------------------------------------  Cardiac Enzymes No results for input(s): TROPONINI in the last 168  hours. ------------------------------------------------------------------------------------------------------------------  RADIOLOGY:  DG Chest 2 View Result Date: 03/27/2024 CLINICAL DATA:  Chest pain EXAM: CHEST - 2 VIEW COMPARISON:  May 01, 2014 FINDINGS: Mild cardiomegaly is noted. Bilateral pulmonary edema is noted with small pleural effusions. Bony thorax is unremarkable. IMPRESSION: Bilateral pulmonary edema is noted with small pleural effusions. Electronically Signed   By: Lynwood Landy Raddle M.D.   On: 03/27/2024 15:48   CT Head Wo Contrast Result Date: 03/27/2024 EXAM: CT HEAD WITHOUT 03/27/2024 03:23:11 PM TECHNIQUE: CT of the head was performed without the administration of intravenous contrast. Automated exposure control, iterative reconstruction, and/or weight based adjustment of the mA/kV was utilized to reduce the radiation dose to as low as reasonably achievable. COMPARISON: 05/01/2014. CLINICAL HISTORY: HA / htn FINDINGS: BRAIN AND VENTRICLES: No acute intracranial hemorrhage. No mass effect or midline shift. No extra-axial fluid collection. No evidence of acute infarct. No hydrocephalus. Tiny 5 mm fatty lesion along left aspect of falx, unchanged since 2016. Mild chronic microvascular ischemic disease. Calcific atherosclerosis. ORBITS: No acute abnormality. SINUSES AND MASTOIDS: No acute abnormality. SOFT TISSUES AND SKULL: No acute skull fracture. No acute soft tissue abnormality. IMPRESSION: 1. No acute intracranial abnormality. Electronically signed by: Donnice Mania MD 03/27/2024 03:32 PM EST RP Workstation: [id number]      IMPRESSION AND PLAN:  Assessment and Plan: * Acute pulmonary edema (HCC) - This is likely secondary to acute new-onset CHF.  I suspect diastolic etiology.  This could be related to hypertensive urgency.  -The patient will be admitted to [facility] cardiac telemetry bed. - We will continue diuresis with IV Lasix.  The patient is already on Jardiance. - Will obtain  [facility] 2D echo. - We will follow serial troponins.  Elevated troponin are likely related to his acute CHF and AKI. - We will follow I's and O's and daily weights. - Cardiology consult will be obtained. - I notified [facility] group about the patient.    Hypertensive urgency - We will continue antihypertensive therapy while holding off nephrotoxins. - He will be placed on as needed IV labetalol and as needed IV hydralazine. - This is likely contributing to #1.  AKI (acute kidney injury) - This could be related to #1 due to renal hypoperfusion. - Will continue diuresis with IV Lasix and follow BMP. - Will avoid nephrotoxins.  BPH (benign prostatic hyperplasia) - Continue Proscar .  Type 2 diabetes mellitus without complications (HCC) - The patient will be placed on supplemental coverage with NovoLog. - Will continue Jardiance and Glucotrol XL.     DVT prophylaxis: Lovenox.  Advanced Care Planning:  Code Status: full code.  Family Communication:  The plan of care was discussed in details with the patient (and family). I answered all questions. The patient agreed to proceed with the above mentioned plan. Further management will  depend upon hospital course. Disposition Plan: Back to previous home environment Consults called: Cardiology All the records are reviewed and case discussed with ED provider.  Status is: Inpatient    At the time of the admission, it appears that the appropriate admission status for this patient is inpatient.  This is judged to be reasonable and necessary in order to provide the required intensity of service to ensure the patient's safety given the presenting symptoms, physical exam findings and initial radiographic and laboratory data in the context of comorbid conditions.  The patient requires inpatient status due to high intensity of service, high risk of further deterioration and high frequency of surveillance required.  I certify that at the time of admission, it  is my clinical judgment that the patient will require inpatient hospital care extending more than 2 midnights.                            Dispo: The patient is from: Home              Anticipated d/c is to: Home              Patient currently is not medically stable to d/c.              Difficult to place patient: No  Madison DELENA Peaches M.D on 03/27/2024 at 11:49 PM  [facility]   From 7 PM-7 AM, contact night-coverage [email or web address]  CC: Primary care physician; Timothy Oneil FALCON, MD     [1] No Known Allergies

## 2024-03-27 NOTE — Assessment & Plan Note (Signed)
 Continue Proscar 

## 2024-03-27 NOTE — ED Provider Notes (Signed)
 [facility] Provider Note    Event Date/Time   First MD Initiated Contact with Patient 03/27/24 1503     (approximate)   History   Chief Complaint Chest Pain   HPI  Timothy [facility] is a 89 y.o. male with past medical history of hypertension, diabetes, renal cancer status post nephrectomy, and CKD who presents to the ED complaining of chest pain.  Patient reports that he first had some cough and difficulty breathing last week, was prescribed prednisone and antibiotic at that time for bronchitis.  He completed the medication and was initially feeling better, but started having difficulty breathing with chest discomfort last night.  He describes the chest discomfort as a fullness around his throat, but denies any cough.  His shortness of breath is worse with exertion and he has begun to notice some swelling in his legs.      Physical Exam   Triage Vital Signs: ED Triage Vitals  Encounter Vitals Group     BP 03/27/24 1454 (!) 201/82     Girls Systolic BP Percentile --      Girls Diastolic BP Percentile --      Boys Systolic BP Percentile --      Boys Diastolic BP Percentile --      Pulse Rate 03/27/24 1454 75     Resp 03/27/24 1454 20     Temp 03/27/24 1454 97.7 F (36.5 C)     Temp Source 03/27/24 1454 Oral     SpO2 03/27/24 1454 98 %     Weight --      Height --      Head Circumference --      Peak Flow --      Pain Score 03/27/24 1455 4     Pain Loc --      Pain Education --      Exclude from Growth Chart --     Most recent vital signs: Vitals:   03/27/24 1800 03/27/24 1815  BP: (!) 179/88   Pulse: 60 74  Resp: (!) 22 20  Temp:    SpO2: 98%     Constitutional: Alert and oriented. Eyes: Conjunctivae are normal. Head: Atraumatic. Nose: No congestion/rhinnorhea. Mouth/Throat: Mucous membranes are moist.  Cardiovascular: Normal rate, regular rhythm. Grossly normal heart sounds.  2+ radial pulses bilaterally. Respiratory: Normal  respiratory effort.  No retractions. Lungs with crackles to bilateral bases, no wheezing noted. Gastrointestinal: Soft and nontender. No distention. Musculoskeletal: No lower extremity tenderness, 2+ pitting edema to knees bilaterally. Neurologic:  Normal speech and language. No gross focal neurologic deficits are appreciated.    ED Results / Procedures / Treatments   Labs (all labs ordered are listed, but only abnormal results are displayed) Labs Reviewed  BASIC METABOLIC PANEL WITH GFR - Abnormal; Notable for the following components:      Result Value   Glucose, Bld 127 (*)    BUN 55 (*)    Creatinine, Ser 3.24 (*)    GFR, Estimated 18 (*)    All other components within normal limits  CBC - Abnormal; Notable for the following components:   WBC 14.7 (*)    RBC 4.20 (*)    Hemoglobin 12.9 (*)    All other components within normal limits  PRO BRAIN NATRIURETIC PEPTIDE - Abnormal; Notable for the following components:   Pro Brain Natriuretic Peptide 15,059.0 (*)    All other components within normal limits  TROPONIN T, HIGH SENSITIVITY - Abnormal;  Notable for the following components:   Troponin T High Sensitivity 54 (*)    All other components within normal limits  TROPONIN T, HIGH SENSITIVITY - Abnormal; Notable for the following components:   Troponin T High Sensitivity 53 (*)    All other components within normal limits     EKG  ED ECG REPORT I, Carlin Palin, the attending physician, personally viewed and interpreted this ECG.   Date: 03/27/2024  EKG Time: 14:57  Rate: 69  Rhythm: normal sinus rhythm, frequent PVC's noted  Axis: LAD  Intervals:none  ST&T Change: Inferolateral T wave inversions, similar to previous  RADIOLOGY Chest x-ray reviewed and interpreted by me with pulmonary edema, no focal infiltrate or effusion noted.  PROCEDURES:  Critical Care performed: No  Procedures   MEDICATIONS ORDERED IN ED: Medications  furosemide (LASIX) injection  80 mg (80 mg Intravenous Given 03/27/24 1618)     IMPRESSION / MDM / ASSESSMENT AND PLAN / ED COURSE  I reviewed the triage vital signs and the nursing notes.                              89 y.o. male with past medical history of hypertension, diabetes, CKD, and renal cancer status post nephrectomy who presents to the ED complaining of shortness of breath and fullness in his throat since trying to lay down last night.  Patient's presentation is most consistent with acute presentation with potential threat to life or bodily function.  Differential diagnosis includes, but is not limited to, ACS, PE, CHF, pneumonia, pneumothorax, musculoskeletal pain, GERD, anxiety.  Patient nontoxic-appearing and in no acute distress, vital signs remarkable for hypertension but otherwise reassuring.  He is not in any respiratory distress and maintaining oxygen saturations at 98% on room air.  Chest discomfort seems atypical for ACS, EKG without evidence of arrhythmia or ischemia.  Initial troponin mildly elevated, but suspect this is due to his CKD, which is stable compared to previous.  We will trend troponin, no significant electrolyte abnormality or anemia noted.  He has mild leukocytosis but no evidence of pneumonia on chest x-ray.  Chest x-ray does show pulmonary edema and he has lower extremity edema concerning for CHF, will add on BNP level and diurese with IV Lasix.  BNP level is significantly elevated, patient did have good diuresis with IV Lasix.  Given new onset heart failure and this 89 year old patient, case discussed with hospitalist for admission.      FINAL CLINICAL IMPRESSION(S) / ED DIAGNOSES   Final diagnoses:  Shortness of breath  New onset of congestive heart failure (HCC)     Rx / DC Orders   ED Discharge Orders     None        Note:  This document was prepared using Dragon voice recognition software and may include unintentional dictation errors.   Palin Carlin,  MD 03/27/24 [phone]

## 2024-03-27 NOTE — ED Notes (Signed)
 Pt urinated in brief, this tech and RN both changed pt brief and bed sheets, applied hospital gown on pt. Pt has family members by bedside with call bell on bed within reach of pt.

## 2024-03-28 ENCOUNTER — Encounter: Payer: Self-pay | Admitting: Family Medicine

## 2024-03-28 DIAGNOSIS — Z8679 Personal history of other diseases of the circulatory system: Secondary | ICD-10-CM

## 2024-03-28 DIAGNOSIS — I509 Heart failure, unspecified: Secondary | ICD-10-CM

## 2024-03-28 LAB — BASIC METABOLIC PANEL WITH GFR
Anion gap: 13 (ref 5–15)
BUN: 56 mg/dL — ABNORMAL HIGH (ref 8–23)
CO2: 21 mmol/L — ABNORMAL LOW (ref 22–32)
Calcium: 8.5 mg/dL — ABNORMAL LOW (ref 8.9–10.3)
Chloride: 103 mmol/L (ref 98–111)
Creatinine, Ser: 3.41 mg/dL — ABNORMAL HIGH (ref 0.61–1.24)
GFR, Estimated: 17 mL/min — ABNORMAL LOW (ref >60)
Glucose, Bld: 149 mg/dL — ABNORMAL HIGH (ref 70–99)
Potassium: 4 mmol/L (ref 3.5–5.1)
Sodium: 137 mmol/L (ref 135–145)

## 2024-03-28 LAB — CBG MONITORING, ED
Glucose-Capillary: 132 mg/dL — ABNORMAL HIGH (ref 70–99)
Glucose-Capillary: 185 mg/dL — ABNORMAL HIGH (ref 70–99)
Glucose-Capillary: 223 mg/dL — ABNORMAL HIGH (ref 70–99)
Glucose-Capillary: 262 mg/dL — ABNORMAL HIGH (ref 70–99)

## 2024-03-28 LAB — HEMOGLOBIN A1C
Hgb A1c MFr Bld: 7.7 % — ABNORMAL HIGH (ref 4.8–5.6)
Mean Plasma Glucose: 174.29 mg/dL

## 2024-03-28 LAB — PROTIME-INR
INR: 1 (ref 0.8–1.2)
Prothrombin Time: 14.2 s (ref 11.4–15.2)

## 2024-03-28 LAB — TROPONIN T, HIGH SENSITIVITY
Troponin T High Sensitivity: 58 ng/L — ABNORMAL HIGH (ref 0–19)
Troponin T High Sensitivity: 62 ng/L — ABNORMAL HIGH (ref 0–19)

## 2024-03-28 LAB — GLUCOSE, CAPILLARY
Glucose-Capillary: 156 mg/dL — ABNORMAL HIGH (ref 70–99)
Glucose-Capillary: 162 mg/dL — ABNORMAL HIGH (ref 70–99)

## 2024-03-28 LAB — APTT: aPTT: 23 s — ABNORMAL LOW (ref 24–36)

## 2024-03-28 MED ORDER — HEPARIN BOLUS VIA INFUSION
3000.0000 [IU] | Freq: Once | INTRAVENOUS | Status: AC
  Administered 2024-03-28: 3000 [IU] via INTRAVENOUS
  Filled 2024-03-28: qty 3000

## 2024-03-28 MED ORDER — CARVEDILOL 6.25 MG PO TABS
12.5000 mg | ORAL_TABLET | Freq: Two times a day (BID) | ORAL | Status: DC
  Administered 2024-03-29 – 2024-04-01 (×7): 12.5 mg via ORAL
  Filled 2024-03-28 (×7): qty 2

## 2024-03-28 MED ORDER — HYDRALAZINE HCL 50 MG PO TABS
50.0000 mg | ORAL_TABLET | Freq: Three times a day (TID) | ORAL | Status: DC
  Administered 2024-03-28 – 2024-03-30 (×5): 50 mg via ORAL
  Filled 2024-03-28 (×5): qty 1

## 2024-03-28 MED ORDER — HYDRALAZINE HCL 50 MG PO TABS: 25.0000 mg | ORAL_TABLET | Freq: Three times a day (TID) | ORAL | Status: DC

## 2024-03-28 MED ORDER — EMPAGLIFLOZIN 10 MG PO TABS
10.0000 mg | ORAL_TABLET | Freq: Every day | ORAL | Status: DC
  Administered 2024-03-28 – 2024-03-31 (×4): 10 mg via ORAL
  Filled 2024-03-28 (×4): qty 1

## 2024-03-28 MED ORDER — INFLUENZA VAC SPLIT HIGH-DOSE 0.5 ML IM SUSY
0.5000 mL | PREFILLED_SYRINGE | INTRAMUSCULAR | Status: DC
  Filled 2024-03-28: qty 0.5

## 2024-03-28 MED ORDER — HEPARIN (PORCINE) 25000 UT/250ML-% IV SOLN
1300.0000 [IU]/h | INTRAVENOUS | Status: DC
  Administered 2024-03-28 – 2024-03-29 (×2): 1300 [IU]/h via INTRAVENOUS
  Filled 2024-03-28 (×2): qty 250

## 2024-03-28 MED ORDER — HYDRALAZINE HCL 20 MG/ML IJ SOLN
10.0000 mg | Freq: Once | INTRAMUSCULAR | Status: AC
  Administered 2024-03-28: 10 mg via INTRAVENOUS

## 2024-03-28 NOTE — Consult Note (Signed)
 PHARMACY - ANTICOAGULATION CONSULT NOTE  Pharmacy Consult for heparin  infusion Indication: atrial fibrillation  Allergies[1]  Patient Measurements: Height: 5' 10 (177.8 cm) Weight: 100.4 kg (221 lb 5.5 oz) IBW/kg (Calculated) : 73 HEPARIN  DW (KG): 94  Vital Signs: Temp: 98 F (36.7 C) (12/13 1500) Temp Source: Oral (12/13 1500) BP: 172/73 (12/13 1500) Pulse Rate: 68 (12/13 1500)  Labs: Recent Labs    03/27/24 1456 03/27/24 2054 03/28/24 0345  HGB 12.9* 13.5  --   HCT 39.3 39.9  --   PLT 364 357  --   CREATININE 3.24* 3.23* 3.41*   Estimated Creatinine Clearance: 17.4 mL/min (A) (by C-G formula based on SCr of 3.41 mg/dL (H)).  Medical History: Past Medical History:  Diagnosis Date   BPH (benign prostatic hyperplasia)    Chronic kidney disease (CKD), stage IV (severe) (HCC)    Degenerative lumbar spinal stenosis    Diabetes mellitus without complication (HCC)    Glaucoma    Hypertension    PAF (paroxysmal atrial fibrillation) (HCC)    Urothelial cancer (HCC)    Medications:  No AC prior to admission  Assessment: PMH of DM, HTN, glaucoma, lumbar spine stenosis, history of A-fib not on anticoagulation. Pharmacy received consult to start heparin  infusion for Afib.   Patient on enoxaparin  Dvt prophylaxis with last dose 12/12 @ 2111. Baseline CBC appropriate to start heparin  infusion. Baseline INR and aPTT ordered.   Goal of Therapy:  Heparin  level 0.3-0.7 units/ml Monitor platelets by anticoagulation protocol: Yes   Plan:  Will give lower dose bolus due to poor renal function and Lovenox  dose < 24 hrs ago. Heparin  3000 units IV BOLUS x 1 dose. Start heparin  infusion at 1300 units/hr Check heparin  level in 8 hours and daily while on heparin  Continue to monitor H&H and platelets  Theresia Pree Rodriguez-Guzman PharmD, BCPS 03/28/2024 4:32 PM       [1] No Known Allergies

## 2024-03-28 NOTE — Progress Notes (Signed)
 Progress Note   Patient: Timothy Duke FMW:[id number] DOB: 08/14/1932 DOA: 03/27/2024     1 DOS: the patient was seen and examined on 03/28/2024   Brief hospital course: HPI: Timothy Duke is a 89 y.o. male w/PMH of DM, HTN, glaucoma, lumbar spine stenosis, history of A-fib not on anticoagulation  who presented to the ER with acute onset of worsening dyspnea with associated orthopnea and paroxysmal nocturnal dyspnea since last night.  The patient admitted to worsening lower extremity edema and dyspnea on exertion.  He has been having dry cough and wheezing.  He denied any chest pain or palpitations.  He was diagnosed with acute bronchitis on 12/1.  He denied any nausea or vomiting or abdominal pain.  No dysuria, oliguria or hematuria or flank pain.   ED Course: When he came to the ER, BP was 121/82 with otherwise normal vital signs.  Labs revealed a BUN of 55 with creatinine 3.24 and proBNP 15,059, high-sensitivity troponin was 54 and later 53.  CBC showed leukocytosis 14.7 with hemoglobin 12.9 hematocrit 39.3. EKG as reviewed by me : EKG showed sinus rhythm with a rate of 69 with occasional premature ventricular complexes, left axis deviation and septal Q waves. Imaging: 2 view chest x-ray showed bilateral pulmonary edema with small pleural effusions. Noncontrast head CT scan showed no acute intracranial normalities.   The patient was given 80 mg of IV Lasix .  He will be admitted to a telemetry bed for further evaluation and management.  Assessment and Plan: * Acute pulmonary edema (HCC), new onset of congestive heart failure - This could be related to hypertensive urgency, history of atrial fibrillation and advanced renal disease. - We will continue diuresis with IV Lasix .  The patient is already on Jardiance . - Will obtain a 2D echo. - We will follow serial troponins. - Elevated troponin are likely related to his acute CHF and AKI. - We will follow I's and O's and daily weights. -  Cardiology input appreciated. - Nephrology will be called to help with management of this patient in the setting of CHF, hypertensive urgency and AKI  Paroxysmal atrial fibrillation - Has not been on anticoagulation, documented in 2023 postoperatively after having urology procedure - Will start him on heparin  drip per cardiology, discontinue Lovenox  - Pharmacy to dose heparin  drip for A-fib, in anticipation with possible procedures per cardiology  Hypertensive urgency - We will continue antihypertensive therapy while holding off nephrotoxins. - Continue hydralazine  50 3 times daily, Coreg  12.5 twice daily, amlodipine  10 mg daily. - Continue on as needed IV labetalol  and as needed IV hydralazine . - This is likely contributing to #1.  AKI (acute kidney injury) - This could be related to #1 due to renal hypoperfusion. - Will continue diuresis with IV Lasix  and follow BMP. - Will avoid nephrotoxins. - Nephrology consult have been requested  BPH (benign prostatic hyperplasia) - Continue Proscar .  Type 2 diabetes mellitus without complications (HCC) - The patient will be placed on supplemental coverage with NovoLog . - Will continue Jardiance  hold glipizide        Subjective: Feels much better, denies any fever chills nausea vomiting but he still complains some shortness of breath  Physical Exam: Vitals:   03/28/24 1345 03/28/24 1400 03/28/24 1437 03/28/24 1527  BP: (!) 163/83 (!) 184/75 (!) 168/66   Pulse: 62 70 71   Resp:  20 15   Temp:   98 F (36.7 C)   TempSrc:   Oral   SpO2: 99%  97% 96%   Weight:    100.4 kg  Height:    5' 10 (1.778 m)   Constitutional: Alert, awake, calm, comfortable HEENT: Neck supple Respiratory: Clear to auscultation B/L, no wheezing, no rales.  Cardiovascular: Regular rate and rhythm, no murmurs / rubs / gallops.  2+ bilateral pitting edema 2+ pedal pulses. No carotid bruits.  Abdomen: Soft, no tenderness, Bowel sounds positive.   Musculoskeletal: no clubbing / cyanosis. Good ROM, no contractures. Normal muscle tone.  Skin: no rashes, lesions, ulcers. Neurologic: CN 2-12 grossly intact. Sensation intact, No focal deficit identified Psychiatric: Alert and oriented x 3. Normal mood.    Data Reviewed:  Reviewed creatinine is 3.41  Family Communication: Family was at bedside  Disposition: Status is: Inpatient Remains inpatient appropriate because: New onset of congestive heart failure, AKI  Planned Discharge Destination: Home    Time spent: 35 minutes  Author: Nena Rebel, MD 03/28/2024 4:02 PM  For on call review [email or web address].

## 2024-03-28 NOTE — Consult Note (Signed)
 Cardiology Consultation:   Patient ID: Timothy Duke; [id number]; 09-09-1932   Admit date: 03/27/2024 Date of Consult: 03/28/2024  Primary Care Provider: Cleotilde Oneil FALCON, MD Primary Cardiologist: New - consult by Dr. Lonni, MD Primary Electrophysiologist:  None   Patient Profile:   Timothy Duke is a 89 y.o. male with a hx of PAF not on anticoagulation diagnosed postoperatively in 10/2021, urothelial carcinoma status post robotic radial nephroureterectomy in 10/2021, CKD IV, DM 2, and HTN who is being seen today for the evaluation of CHF at the request of Dr. Lawence.  History of Present Illness:   Mr. Cui underwent echo at [facility] in 10/2021 at time of initial A-fib diagnosis, that showed an EF of 55 to 60% with aortic valve sclerosis and normal RV systolic function/ventricular cavity size.  He was started on anticoagulation.  He has not followed up with cardiology since nor been maintained on anticoagulation.  He presented to [facility] on the afternoon of 03/27/2024 with onset of worsening dyspnea and orthopnea the night prior.  He reports an approximate 10 pound weight gain over the past 3 to 4 months.  Never with chest pain.  No abdominal distention or early satiety.  No palpitations, even when in documented A-fib in 2023.  He is unclear if he is currently taking a fluid pill at home.  Not currently followed by nephrology.  Upon arrival, he was markedly hypertensive with blood pressure 201/82 trending to 231/99, heart rate in the 60s to 70s bpm, oxygen saturation 98 to 100% on room air, and afebrile.  CT head showed no acute intracranial abnormality.  Chest x-ray showed bilateral pulmonary edema with small pleural effusions.  Labs: High-sensitivity troponin 54 with a delta troponin 53 trending to 62, proBNP 15059, WBC 14.7 trending to 16.6, Hgb 12.9 trending to 13.5, BUN 55, serum creatinine 3.24 trending to 3.41, potassium 5.0 trending to 4.0.  He has received IV Lasix  80 mg in the ER on 12/12  and 40 mg this morning.  Regarding management of blood pressure, he has received hydralazine  10 mg x 1 and labetalol  20 mg x 4 doses.  Blood pressure in the room is improving to the 180s mmHg systolic.  He reports improvement in underlying dyspnea.  No chest pain.  Documented urine output 3.1 L for the admission to date.  Renal function largely stable at 3.41 this morning.    Past Medical History:  Diagnosis Date   BPH (benign prostatic hyperplasia)    Chronic kidney disease (CKD), stage IV (severe) (HCC)    Degenerative lumbar spinal stenosis    Diabetes mellitus without complication (HCC)    Glaucoma    Hypertension    PAF (paroxysmal atrial fibrillation) (HCC)    Urothelial cancer (HCC)     Past Surgical History:  Procedure Laterality Date   APPENDECTOMY     CYSTOSCOPY W/ RETROGRADES Right 03/14/2021   Procedure: CYSTOSCOPY WITH RIGHT SIDE RETROGRADE PYELOGRAM;  Surgeon: Twylla Glendia JAYSON, MD;  Location: [facility] ORS;  Service: Urology;  Laterality: Right;   EYE SURGERY     KNEE SURGERY     SHOULDER SURGERY  1996   TRIGGER FINGER RELEASE Left    URETEROSCOPY Right 03/14/2021   Procedure: URETEROSCOPY WITH STENT PLACEMENT;  Surgeon: Twylla Glendia JAYSON, MD;  Location: [facility] ORS;  Service: Urology;  Laterality: Right;     Home Meds: Prior to Admission medications  Medication Sig Start Date End Date Taking? Authorizing Provider  Acetaminophen  500 MG coapsule Take  500 mg by mouth daily.   Yes [provider]  amLODipine  (NORVASC ) 10 MG tablet Take by mouth. 07/15/15  Yes [provider]  aspirin  EC 81 MG tablet Take 81 mg by mouth daily.   Yes [provider]  cloNIDine  (CATAPRES ) 0.1 MG tablet SMARTSIG:0.5 Tablet(s) By Mouth Every Evening 06/02/21  Yes [provider]  furosemide  (LASIX ) 20 MG tablet Take 20 mg by mouth daily.   Yes [provider]  glipiZIDE  (GLUCOTROL  XL) 10 MG 24 hr tablet Take 10 mg by mouth daily with breakfast. 02/29/20  03/27/24 Yes [provider]  latanoprost  (XALATAN ) 0.005 % ophthalmic solution Place 1 drop into both eyes at bedtime.   Yes [provider]  lisinopril -hydrochlorothiazide  (ZESTORETIC ) 20-12.5 MG tablet Take 1 tablet by mouth daily. 03/11/21  Yes [provider]  Metoprolol  Succinate 50 MG CS24 Take 1 tablet by mouth daily at 12 noon.   Yes [provider]  Multiple Vitamin (MULTI-VITAMINS) TABS Take 1 tablet by mouth daily.   Yes [provider]  OZEMPIC, 1 MG/DOSE, 4 MG/3ML SOPN Inject 1 mg into the skin once a week.   Yes [provider]  timolol  (TIMOPTIC ) 0.5 % ophthalmic solution Place 1 drop into both eyes 2 (two) times daily. 03/30/14  Yes [provider]  finasteride  (PROSCAR ) 5 MG tablet Take 1 tablet (5 mg total) by mouth daily. Patient not taking: Reported on 03/27/2024 07/17/21   Twylla Glendia BROCKS, MD  glucose blood test strip USE AS DIRECTED TWICE DAILY 05/12/18   [provider]  hydrALAZINE  (APRESOLINE ) 50 MG tablet Take 50 mg by mouth 2 (two) times daily. Patient not taking: Reported on 03/27/2024 04/11/16   [provider]  JARDIANCE  10 MG TABS tablet Take 10 mg by mouth daily. Patient not taking: Reported on 03/27/2024 02/13/21   [provider]  mirabegron  ER (MYRBETRIQ ) 25 MG TB24 tablet Take 1 tablet (25 mg total) by mouth daily. Patient not taking: Reported on 03/27/2024 03/15/21   Helon Kirsch A, PA-C  olmesartan-hydrochlorothiazide  (BENICAR HCT) 40-12.5 MG tablet Take 1 tablet by mouth daily. Patient not taking: Reported on 03/27/2024 05/16/21   [provider]  PRODIGY NO CODING BLOOD GLUC test strip 2 (two) times daily. as directed 05/13/18   [provider]  PRODIGY TWIST TOP LANCETS 28G MISC USE TO CHECK BLOOD SUGAR TWICE DAILY AS INSTRUCTED 04/17/18   [provider]  TRADJENTA 5 MG TABS tablet Take 5 mg by mouth daily. Patient not taking: Reported on  03/27/2024 09/29/20   [provider]    Inpatient Medications: Scheduled Meds:  amLODipine   10 mg Oral Daily   aspirin  EC  81 mg Oral Daily   cloNIDine   0.1 mg Oral Daily   enoxaparin  (LOVENOX ) injection  30 mg Subcutaneous QHS   furosemide   40 mg Intravenous Q12H   glipiZIDE   10 mg Oral Q breakfast   insulin  aspart  0-5 Units Subcutaneous QHS   insulin  aspart  0-9 Units Subcutaneous TID WC   latanoprost   1 drop Both Eyes QHS   metoprolol  succinate  50 mg Oral Q1200   multivitamin with minerals  1 tablet Oral Daily   sodium chloride  flush  3 mL Intravenous Q12H   timolol   1 drop Both Eyes BID   Continuous Infusions:  sodium chloride  Stopped (03/28/24 0824)   PRN Meds: sodium chloride , acetaminophen , ALPRAZolam , hydrALAZINE , labetalol , ondansetron  (ZOFRAN ) IV, sodium chloride  flush, traZODone   Allergies:  Allergies[1]  Social  History:   Social History   Socioeconomic History   Marital status: Widowed    Spouse name: Not on file   Number of children: Not on file   Years of education: Not on file   Highest education level: Not on file  Occupational History   Not on file  Tobacco Use   Smoking status: Never   Smokeless tobacco: Current    Types: Chew  Vaping Use   Vaping status: Never Used  Substance and Sexual Activity   Alcohol use: No   Drug use: No   Sexual activity: Not on file  Other Topics Concern   Not on file  Social History Narrative   Not on file   Social Drivers of Health   Tobacco Use: High Risk (03/27/2024)   Patient History    Smoking Tobacco Use: Never    Smokeless Tobacco Use: Current    Passive Exposure: Not on file  Financial Resource Strain: Low Risk  (12/17/2023)   Received from [facility]   Overall Financial Resource Strain (CARDIA)    Difficulty of Paying Living Expenses: Not hard at all  Food Insecurity: No Food Insecurity (12/17/2023)   Received from [facility]   Epic    Within the past  12 months, you worried that your food would run out before you got the money to buy more.: Never true    Within the past 12 months, the food you bought just didn't last and you didn't have money to get more.: Never true  Transportation Needs: No Transportation Needs (12/17/2023)   Received from [facility] - Transportation    In the past 12 months, has lack of transportation kept you from medical appointments or from getting medications?: No    Lack of Transportation (Non-Medical): No  Physical Activity: Not on file  Stress: Not on file  Social Connections: Not on file  Intimate Partner Violence: Not on file  Depression (EYV7-0): Not on file  Alcohol Screen: Not on file  Housing: Low Risk  (12/17/2023)   Received from [facility]   Epic    In the last 12 months, was there a time when you were not able to pay the mortgage or rent on time?: No    In the past 12 months, how many times have you moved where you were living?: 0    At any time in the past 12 months, were you homeless or living in a shelter (including now)?: No  Utilities: Not At Risk (12/17/2023)   Received from [facility]   Epic    In the past 12 months has the electric, gas, oil, or water company threatened to shut off services in your home?: No  Health Literacy: Not on file     Family History:   Family History  Problem Relation Age of Onset   Stroke Mother    Hypertension Mother    Diabetes Mother    Cancer Mother    Heart attack Brother     ROS:  Review of Systems  Constitutional:  Positive for malaise/fatigue. Negative for chills, diaphoresis, fever and weight loss.  HENT:  Negative for congestion.   Eyes:  Negative for discharge and redness.  Respiratory:  Positive for shortness of breath. Negative for cough, sputum production and wheezing.   Cardiovascular:  Positive for orthopnea and leg swelling. Negative for chest pain, palpitations, claudication  and PND.  Gastrointestinal:  Negative  for abdominal pain, blood in stool, heartburn, melena, nausea and vomiting.  Musculoskeletal:  Negative for falls and myalgias.  Skin:  Negative for rash.  Neurological:  Positive for weakness. Negative for dizziness, tingling, tremors, sensory change, speech change, focal weakness and loss of consciousness.  Endo/Heme/Allergies:  Does not bruise/bleed easily.  Psychiatric/Behavioral:  Negative for substance abuse. The patient is not nervous/anxious.   All other systems reviewed and are negative.     Physical Exam/Data:   Vitals:   03/28/24 0745 03/28/24 0800 03/28/24 0815 03/28/24 0830  BP: (!) 170/75 (!) 182/87 (!) 191/85 (!) 210/59  Pulse: 71 70 74 (!) 147  Resp: 14 15 19  (!) 25  Temp:      TempSrc:      SpO2: 98% 97% 98% 94%  Weight:      Height:        Intake/Output Summary (Last 24 hours) at 03/28/2024 0843 Last data filed at 03/28/2024 0719 Gross per 24 hour  Intake --  Output 3175 ml  Net -3175 ml   Filed Weights   03/27/24 1952 03/27/24 2117  Weight: 103.9 kg 100.5 kg   Body mass index is 31.79 kg/m.   Physical Exam: General: Well developed, well nourished, in no acute distress. Head: Normocephalic, atraumatic, sclera non-icteric, no xanthomas, nares without discharge.  Neck: Negative for carotid bruits. JVD ~ 8 cm. Lungs: Diminished breath sounds along the bilateral bases.  Breathing is unlabored. Heart: RRR with S1 S2. No murmurs, rubs, or gallops appreciated. Abdomen: Soft, non-tender, non-distended with normoactive bowel sounds. No hepatomegaly. No rebound/guarding. No obvious abdominal masses. Msk:  Strength and tone appear normal for age. Extremities: No clubbing or cyanosis. Mild bilateral lower extremity edema. Distal pedal pulses are 2+ and equal bilaterally. Neuro: Alert and oriented X 3. No facial asymmetry. No focal deficit. Moves all extremities spontaneously. Psych:  Responds to questions appropriately with a  normal affect.   EKG:  The EKG was personally reviewed and demonstrates: NSR, 69 bpm, multifocal PVCs, left axis deviation, possible prior septal infarct, nonspecific inferior changes with lateral T wave inversion that is similar to prior tracing  Telemetry:  Telemetry was personally reviewed and demonstrates: Sinus rhythm with frequent PVCs occasional ventricular couplet and triplet, rare episode of ventricular trigeminy  Weights: Filed Weights   03/27/24 1952 03/27/24 2117  Weight: 103.9 kg 100.5 kg    Relevant CV Studies:  2D echo 10/30/2021 [facility]): Summary   1. The left ventricle is normal in size with mildly increased wall  thickness.   2. The left ventricular systolic function is normal, LVEF is visually  estimated at 55-60%.    3. Aortic sclerosis.    4. The right ventricle is normal in size, with normal systolic function.   Laboratory Data:  Chemistry Recent Labs  Lab 03/27/24 1456 03/27/24 2054 03/28/24 0345  NA 135  --  137  K 5.0  --  4.0  CL 101  --  103  CO2 23  --  21*  GLUCOSE 127*  --  149*  BUN 55*  --  56*  CREATININE 3.24* 3.23* 3.41*  CALCIUM 9.3  --  8.5*  GFRNONAA 18* 18* 17*  ANIONGAP 11  --  13    No results for input(s): PROT, ALBUMIN, AST, ALT, ALKPHOS, BILITOT in the last 168 hours. Hematology Recent Labs  Lab 03/27/24 1456 03/27/24 2054  WBC 14.7* 16.6*  RBC 4.20* 4.33  HGB 12.9* 13.5  HCT 39.3 39.9  MCV 93.6  92.1  MCH 30.7 31.2  MCHC 32.8 33.8  RDW 13.5 13.2  PLT 364 357   Cardiac EnzymesNo results for input(s): TROPONINI in the last 168 hours. No results for input(s): TROPIPOC in the last 168 hours.  BNP Recent Labs  Lab 03/27/24 1456  PROBNP 15,059.0*    DDimer No results for input(s): DDIMER in the last 168 hours.  Radiology/Studies:  DG Chest 2 View Result Date: 03/27/2024 IMPRESSION: Bilateral pulmonary edema is noted with small pleural effusions. Electronically Signed   By: Lynwood Landy Raddle M.D.    On: 03/27/2024 15:48   CT Head Wo Contrast Result Date: 03/27/2024 IMPRESSION: 1. No acute intracranial abnormality. Electronically signed by: Donnice Mania MD 03/27/2024 03:32 PM EST RP Workstation: [id number]    Assessment and Plan:   1. Acute CHF:  - Type and chronicity are unclear  - This is a multifactorial process including hypertensive emergency and advanced renal disease with possible further cardiac involvement pending evaluation  -  for now, with good urine output on IV Lasix  this will be continued, though may need to consider transitioning to furosemide  drip with progressive renal dysfunction  - Would recommend nephrology involvement to assist in diuresis, defer consult to internal medicine  - Obtain echo with further recommendations pending results  - Daily weights with strict I's and O's   2. Elevated high-sensitivity troponin:  - Never with chest pain - Mildly elevated and flat trending, not consistent with ACS  - Likely supply/demand ischemia in the setting of volume overload with poor renal clearance with significant underlying renal dysfunction  - Given history of A-fib, initiate heparin  drip until it is clear the patient will not require inpatient invasive testing  - Obtain echo with further recommendations pending  - Not an invasive ischemic evaluation candidate with advanced renal disease   3. Hypertensive emergency:  - Contributing to presentation  - Blood pressure remains poorly controlled at 217/72  - Currently has amlodipine  10 mg, clonidine  0.1 mg daily, and Toprol -XL 50 mg ordered  - Escalate pharmacotherapy as indicated once he has received morning medications  4. PAF:  - Initially diagnosed in 10/2021  - Maintaining sinus rhythm  - Has not been maintained on anticoagulation as an outpatient  - CHA2DS2-VASc at least 6 (CHF, HTN, age x 2, DM, vascular disease)  - It is unclear if this was a lone episode in 2023 in the postoperative setting or if he has  continued to have paroxysms of A-fib (was asymptomatic while in documented A-fib in 2023) - Monitor on telemetry - Will discuss long-term OAC with MD  5. CKD stage IV:  - Contributing to volume overload  - Recommended nephrology input to assist with diuresis    For questions or updates, please contact [facility] Please consult [email or web address] for contact info under Cardiology/STEMI.   Signed, Bernardino Bring, PA-C [facility] Pager: [phone] 03/28/2024, 8:43 AM       [1] No Known Allergies

## 2024-03-28 NOTE — Plan of Care (Signed)
   Problem: Education: Goal: Ability to describe self-care measures that may prevent or decrease complications (Diabetes Survival Skills Education) will improve Outcome: Progressing   Problem: Coping: Goal: Ability to adjust to condition or change in health will improve Outcome: Progressing   Problem: Fluid Volume: Goal: Ability to maintain a balanced intake and output will improve Outcome: Progressing

## 2024-03-28 NOTE — ED Notes (Signed)
 Patient denies chest pain at this time. Daughter at bedside. Patient is alert and verbal.

## 2024-03-28 NOTE — ED Notes (Signed)
 Will given insulin  when lunch tray arrives.

## 2024-03-28 NOTE — Hospital Course (Addendum)
 HPI: Timothy Duke is a 89 y.o. male w/PMH of DM, HTN, glaucoma, lumbar spine stenosis, history of A-fib not on anticoagulation  who presented to the ER with acute onset of worsening dyspnea with associated orthopnea and paroxysmal nocturnal dyspnea since last night.  The patient admitted to worsening lower extremity edema and dyspnea on exertion.  He has been having dry cough and wheezing.  He denied any chest pain or palpitations.  He was diagnosed with acute bronchitis on 12/1.  He denied any nausea or vomiting or abdominal pain.  No dysuria, oliguria or hematuria or flank pain.   ED Course: When he came to the ER, BP was 121/82 with otherwise normal vital signs.  Labs revealed a BUN of 55 with creatinine 3.24 and proBNP 15,059, high-sensitivity troponin was 54 and later 53.  CBC showed leukocytosis 14.7 with hemoglobin 12.9 hematocrit 39.3. EKG as reviewed by me : EKG showed sinus rhythm with a rate of 69 with occasional premature ventricular complexes, left axis deviation and septal Q waves. Imaging: 2 view chest x-ray showed bilateral pulmonary edema with small pleural effusions. Noncontrast head CT scan showed no acute intracranial normalities.   The patient was given 80 mg of IV Lasix .  He will be admitted to a telemetry bed for further evaluation and management.  12/14: Nephrology evaluated patient and advised to stop diuretics and start on gentle hydration, IVF normal saline at 40 cc/h  12/16: I assumed care of the pt. TOC Luna, RN) messaged me via secure chat, stating patient does not have TOC need at this time.  Labs were not ordered, CMP was put in late in the morning.  Renal function does appear to be improving however not really at baseline yet.  Will defer management to nephrology.  Still pending nephrology note at this time.

## 2024-03-28 NOTE — ED Notes (Signed)
 Patient's eye drops are with patient upon admission. Patient's hearing aids and charger and cell phone are with the patient upon admission.

## 2024-03-28 NOTE — ED Notes (Addendum)
 Dr. Paudel was contact via text about labetalol  IV prn. Dose was going to be given , but next blood pressure did not meet criteria. Daughter states patient is in the 170's to 180' systolic at home daily.

## 2024-03-28 NOTE — ED Notes (Signed)
 Report given Jenna E RN

## 2024-03-29 ENCOUNTER — Inpatient Hospital Stay

## 2024-03-29 ENCOUNTER — Inpatient Hospital Stay: Admit: 2024-03-29 | Discharge: 2024-03-29 | Disposition: A | Attending: Family Medicine

## 2024-03-29 DIAGNOSIS — I5031 Acute diastolic (congestive) heart failure: Secondary | ICD-10-CM

## 2024-03-29 DIAGNOSIS — N179 Acute kidney failure, unspecified: Secondary | ICD-10-CM

## 2024-03-29 LAB — COMPREHENSIVE METABOLIC PANEL WITH GFR
ALT: 21 U/L (ref 0–44)
AST: 12 U/L — ABNORMAL LOW (ref 15–41)
Albumin: 3.6 g/dL (ref 3.5–5.0)
Alkaline Phosphatase: 70 U/L (ref 38–126)
Anion gap: 14 (ref 5–15)
BUN: 56 mg/dL — ABNORMAL HIGH (ref 8–23)
CO2: 23 mmol/L (ref 22–32)
Calcium: 9 mg/dL (ref 8.9–10.3)
Chloride: 101 mmol/L (ref 98–111)
Creatinine, Ser: 3.77 mg/dL — ABNORMAL HIGH (ref 0.61–1.24)
GFR, Estimated: 15 mL/min — ABNORMAL LOW (ref >60)
Glucose, Bld: 145 mg/dL — ABNORMAL HIGH (ref 70–99)
Potassium: 4 mmol/L (ref 3.5–5.1)
Sodium: 138 mmol/L (ref 135–145)
Total Bilirubin: 0.5 mg/dL (ref 0.0–1.2)
Total Protein: 6.4 g/dL — ABNORMAL LOW (ref 6.5–8.1)

## 2024-03-29 LAB — ECHOCARDIOGRAM COMPLETE
AR max vel: 3.33 cm2
AV Area VTI: 3.04 cm2
AV Area mean vel: 2.91 cm2
AV Mean grad: 3 mmHg
AV Peak grad: 6.3 mmHg
Ao pk vel: 1.25 m/s
Area-P 1/2: 5.27 cm2
Calc EF: 53.5 %
Height: 70 in
MV VTI: 2.29 cm2
P 1/2 time: 688 ms
S' Lateral: 3.7 cm
Single Plane A2C EF: 51.2 %
Single Plane A4C EF: 55.3 %
Weight: 3559.11 [oz_av]

## 2024-03-29 LAB — CBC
HCT: 36.8 % — ABNORMAL LOW (ref 39.0–52.0)
Hemoglobin: 12.7 g/dL — ABNORMAL LOW (ref 13.0–17.0)
MCH: 31.4 pg (ref 26.0–34.0)
MCHC: 34.5 g/dL (ref 30.0–36.0)
MCV: 90.9 fL (ref 80.0–100.0)
Platelets: 317 K/uL (ref 150–400)
RBC: 4.05 MIL/uL — ABNORMAL LOW (ref 4.22–5.81)
RDW: 13.2 % (ref 11.5–15.5)
WBC: 17.6 K/uL — ABNORMAL HIGH (ref 4.0–10.5)
nRBC: 0 % (ref 0.0–0.2)

## 2024-03-29 LAB — GLUCOSE, CAPILLARY
Glucose-Capillary: 157 mg/dL — ABNORMAL HIGH (ref 70–99)
Glucose-Capillary: 166 mg/dL — ABNORMAL HIGH (ref 70–99)
Glucose-Capillary: 192 mg/dL — ABNORMAL HIGH (ref 70–99)
Glucose-Capillary: 216 mg/dL — ABNORMAL HIGH (ref 70–99)

## 2024-03-29 LAB — HEPARIN LEVEL (UNFRACTIONATED)
Heparin Unfractionated: 0.33 [IU]/mL (ref 0.30–0.70)
Heparin Unfractionated: 0.38 [IU]/mL (ref 0.30–0.70)

## 2024-03-29 LAB — MAGNESIUM: Magnesium: 2.1 mg/dL (ref 1.7–2.4)

## 2024-03-29 MED ORDER — SODIUM CHLORIDE 0.9 % IV SOLN
INTRAVENOUS | Status: AC
  Administered 2024-03-30: 08:00:00 1000 mL via INTRAVENOUS

## 2024-03-29 MED ORDER — CLONIDINE HCL 0.1 MG PO TABS
0.1000 mg | ORAL_TABLET | Freq: Once | ORAL | Status: AC
  Administered 2024-03-29: 0.1 mg via ORAL
  Filled 2024-03-29: qty 1

## 2024-03-29 NOTE — Consult Note (Signed)
 PHARMACY - ANTICOAGULATION CONSULT NOTE  Pharmacy Consult for heparin  infusion Indication: atrial fibrillation  Allergies[1]  Patient Measurements: Height: 5' 10 (177.8 cm) Weight: 100.9 kg (222 lb 7.1 oz) IBW/kg (Calculated) : 73 HEPARIN  DW (KG): 94  Vital Signs: Temp: 98.1 F (36.7 C) (12/14 0827) BP: 187/90 (12/14 0827) Pulse Rate: 73 (12/14 0827)  Labs: Recent Labs    03/27/24 1456 03/27/24 2054 03/28/24 0345 03/28/24 1719 03/29/24 0136 03/29/24 1042  HGB 12.9* 13.5  --   --  12.7*  --   HCT 39.3 39.9  --   --  36.8*  --   PLT 364 357  --   --  317  --   APTT  --   --   --  23*  --   --   LABPROT  --   --   --  14.2  --   --   INR  --   --   --  1.0  --   --   HEPARINUNFRC  --   --   --   --  0.33 0.38  CREATININE 3.24* 3.23* 3.41*  --  3.77*  --    Estimated Creatinine Clearance: 15.8 mL/min (A) (by C-G formula based on SCr of 3.77 mg/dL (H)).  Medical History: Past Medical History:  Diagnosis Date   BPH (benign prostatic hyperplasia)    Chronic kidney disease (CKD), stage IV (severe) (HCC)    Degenerative lumbar spinal stenosis    Diabetes mellitus without complication (HCC)    Glaucoma    Hypertension    PAF (paroxysmal atrial fibrillation) (HCC)    Urothelial cancer (HCC)    Medications:  No AC prior to admission  Assessment: PMH of DM, HTN, glaucoma, lumbar spine stenosis, history of A-fib not on anticoagulation. Pharmacy received consult to start heparin  infusion for Afib.   Patient on enoxaparin  Dvt prophylaxis with last dose 12/12 @ 2111. Baseline CBC appropriate to start heparin  infusion. Baseline INR and aPTT ordered.   Goal of Therapy:  Heparin  level 0.3-0.7 units/ml Monitor platelets by anticoagulation protocol: Yes  12/14 0136 HL 0.33, therapeutic x 1 12/14 1042 HL 0.38, therapeutic x 2   Plan:  Continue heparin  infusion at 1300 units/hr Recheck heparin  level and CBC tomorrow AM  Marolyn KATHEE Mare 03/29/2024 11:31 AM    [1] No  Known Allergies

## 2024-03-29 NOTE — Progress Notes (Signed)
*  PRELIMINARY RESULTS* Echocardiogram 2D Echocardiogram has been performed.  Timothy Duke Ramanda Paules 03/29/2024, 9:52 AM

## 2024-03-29 NOTE — Consult Note (Signed)
 [facility] CONSULT NOTE    Date: 03/29/2024                  Patient Name:  Timothy Duke  MRN: [id number]  DOB: Apr 24, 1932  Age / Sex: 89 y.o., male         PCP: Cleotilde Oneil FALCON, MD                 Service Requesting Consult: Hospitalist                 Reason for Consult: Chronic kidney disease stage IV            History of Present Illness: Patient is a 89 y.o. male with a PMHx of diabetes mellitus type 2, hypertension, glaucoma, lumbar spinal stenosis, chronic kidney disease stage IV with history of right-sided nephrectomy, anemia of chronic kidney disease, BPH who was admitted to [facility] on 03/27/2024 for evaluation of worsening lower extremity edema and dyspnea with exertion.  He was admitted ultimately for acute pulmonary edema.  2D echocardiogram was performed which showed normal ejection fraction.  Indeterminate diastolic parameters.  We were asked to see him for evaluation management of chronic kidney disease stage IV.  Back in August his creatinine was 3.4.  In October creatinine was 3.3.  Currently creatinine 3.7 with an eGFR of 15.  Also has associated anemia chronic kidney disease with hemoglobin of 12.7.  Was on Lasix  however this was subsequently held.   Medications: Outpatient medications: Medications Prior to Admission  Medication Sig Dispense Refill Last Dose/Taking   Acetaminophen  500 MG coapsule Take 500 mg by mouth daily.   Taking   amLODipine  (NORVASC ) 10 MG tablet Take by mouth.   03/27/2024   aspirin  EC 81 MG tablet Take 81 mg by mouth daily.   03/27/2024   cloNIDine  (CATAPRES ) 0.1 MG tablet SMARTSIG:0.5 Tablet(s) By Mouth Every Evening   03/27/2024   furosemide  (LASIX ) 20 MG tablet Take 20 mg by mouth daily.   Taking   glipiZIDE  (GLUCOTROL  XL) 10 MG 24 hr tablet Take 10 mg by mouth daily with breakfast.   Taking   latanoprost  (XALATAN ) 0.005 % ophthalmic solution Place 1 drop into both eyes at bedtime.   03/26/2024 Bedtime    lisinopril -hydrochlorothiazide  (ZESTORETIC ) 20-12.5 MG tablet Take 1 tablet by mouth daily.   03/26/2024 Morning   Metoprolol  Succinate 50 MG CS24 Take 1 tablet by mouth daily at 12 noon.   Unknown   Multiple Vitamin (MULTI-VITAMINS) TABS Take 1 tablet by mouth daily.   Past Month   OZEMPIC, 1 MG/DOSE, 4 MG/3ML SOPN Inject 1 mg into the skin once a week.   Taking   timolol  (TIMOPTIC ) 0.5 % ophthalmic solution Place 1 drop into both eyes 2 (two) times daily.   03/27/2024 Morning   finasteride  (PROSCAR ) 5 MG tablet Take 1 tablet (5 mg total) by mouth daily. (Patient not taking: Reported on 03/27/2024) 90 tablet 2 Not Taking   glucose blood test strip USE AS DIRECTED TWICE DAILY      hydrALAZINE  (APRESOLINE ) 50 MG tablet Take 50 mg by mouth 2 (two) times daily. (Patient not taking: Reported on 03/27/2024)   Not Taking   JARDIANCE  10 MG TABS tablet Take 10 mg by mouth daily. (Patient not taking: Reported on 03/27/2024)   Not Taking   mirabegron  ER (MYRBETRIQ ) 25 MG TB24 tablet Take 1 tablet (25 mg total) by mouth daily. (Patient not taking: Reported on 03/27/2024) 28 tablet 0  Not Taking   olmesartan-hydrochlorothiazide  (BENICAR HCT) 40-12.5 MG tablet Take 1 tablet by mouth daily. (Patient not taking: Reported on 03/27/2024)   Not Taking   PRODIGY NO CODING BLOOD GLUC test strip 2 (two) times daily. as directed      PRODIGY TWIST TOP LANCETS 28G MISC USE TO CHECK BLOOD SUGAR TWICE DAILY AS INSTRUCTED      TRADJENTA 5 MG TABS tablet Take 5 mg by mouth daily. (Patient not taking: Reported on 03/27/2024)   Not Taking    Current medications: Current Facility-Administered Medications  Medication Dose Route Frequency Provider Last Rate Last Admin   acetaminophen  (TYLENOL ) tablet 650 mg  650 mg Oral Q4H PRN Mansy, Jan A, MD       ALPRAZolam  (XANAX ) tablet 0.25 mg  0.25 mg Oral BID PRN Mansy, Jan A, MD       amLODipine  (NORVASC ) tablet 10 mg  10 mg Oral Daily Mansy, Jan A, MD   10 mg at 03/29/24 9057    aspirin  EC tablet 81 mg  81 mg Oral Daily Mansy, Jan A, MD   81 mg at 03/29/24 9057   carvedilol  (COREG ) tablet 12.5 mg  12.5 mg Oral BID WC Lonni Slain, MD   12.5 mg at 03/29/24 9177   empagliflozin  (JARDIANCE ) tablet 10 mg  10 mg Oral Daily Paudel, Keshab, MD   10 mg at 03/29/24 0943   hydrALAZINE  (APRESOLINE ) injection 10 mg  10 mg Intravenous Q6H PRN Mansy, Jan A, MD   10 mg at 03/29/24 0055   hydrALAZINE  (APRESOLINE ) tablet 50 mg  50 mg Oral TID Paudel, Keshab, MD   50 mg at 03/29/24 9057   Influenza vac split trivalent PF (FLUZONE  HIGH-DOSE) injection 0.5 mL  0.5 mL Intramuscular Tomorrow-1000 Roann Gouty, MD       insulin  aspart (novoLOG ) injection 0-5 Units  0-5 Units Subcutaneous QHS Mansy, Jan A, MD       insulin  aspart (novoLOG ) injection 0-9 Units  0-9 Units Subcutaneous TID WC Mansy, Jan A, MD   2 Units at 03/29/24 1223   labetalol  (NORMODYNE ) injection 20 mg  20 mg Intravenous Q3H PRN Mansy, Jan A, MD   20 mg at 03/28/24 2048   latanoprost  (XALATAN ) 0.005 % ophthalmic solution 1 drop  1 drop Both Eyes QHS Mansy, Jan A, MD       multivitamin with minerals tablet 1 tablet  1 tablet Oral Daily Mansy, Jan A, MD   1 tablet at 03/29/24 9057   ondansetron  (ZOFRAN ) injection 4 mg  4 mg Intravenous Q6H PRN Mansy, Jan A, MD       sodium chloride  flush (NS) 0.9 % injection 3 mL  3 mL Intravenous Q12H Mansy, Jan A, MD   3 mL at 03/29/24 0945   sodium chloride  flush (NS) 0.9 % injection 3 mL  3 mL Intravenous PRN Mansy, Jan A, MD       timolol  (TIMOPTIC ) 0.5 % ophthalmic solution 1 drop  1 drop Both Eyes BID Mansy, Jan A, MD   1 drop at 03/29/24 0947   traZODone  (DESYREL ) tablet 25 mg  25 mg Oral QHS PRN Mansy, Jan A, MD   25 mg at 03/28/24 2042      Allergies: Allergies[1]    Past Medical History: Past Medical History:  Diagnosis Date   BPH (benign prostatic hyperplasia)    Chronic kidney disease (CKD), stage IV (severe) (HCC)    Degenerative lumbar spinal stenosis     Diabetes mellitus without  complication (HCC)    Glaucoma    Hypertension    PAF (paroxysmal atrial fibrillation) (HCC)    Urothelial cancer (HCC)      Past Surgical History: Past Surgical History:  Procedure Laterality Date   APPENDECTOMY     CYSTOSCOPY W/ RETROGRADES Right 03/14/2021   Procedure: CYSTOSCOPY WITH RIGHT SIDE RETROGRADE PYELOGRAM;  Surgeon: Twylla Glendia BROCKS, MD;  Location: [facility] ORS;  Service: Urology;  Laterality: Right;   EYE SURGERY     KNEE SURGERY     SHOULDER SURGERY  1996   TRIGGER FINGER RELEASE Left    URETEROSCOPY Right 03/14/2021   Procedure: URETEROSCOPY WITH STENT PLACEMENT;  Surgeon: Twylla Glendia BROCKS, MD;  Location: [facility] ORS;  Service: Urology;  Laterality: Right;     Family History: Family History  Problem Relation Age of Onset   Stroke Mother    Hypertension Mother    Diabetes Mother    Cancer Mother    Heart attack Brother      Social History: Social History   Socioeconomic History   Marital status: Widowed    Spouse name: Not on file   Number of children: Not on file   Years of education: Not on file   Highest education level: Not on file  Occupational History   Not on file  Tobacco Use   Smoking status: Never   Smokeless tobacco: Current    Types: Chew  Vaping Use   Vaping status: Never Used  Substance and Sexual Activity   Alcohol use: No   Drug use: No   Sexual activity: Not on file  Other Topics Concern   Not on file  Social History Narrative   Not on file   Social Drivers of Health   Tobacco Use: High Risk (03/27/2024)   Patient History    Smoking Tobacco Use: Never    Smokeless Tobacco Use: Current    Passive Exposure: Not on file  Financial Resource Strain: Low Risk  (12/17/2023)   Received from [facility]   Overall Financial Resource Strain (CARDIA)    Difficulty of Paying Living Expenses: Not hard at all  Food Insecurity: No Food Insecurity (03/28/2024)   Epic    Worried About Chiropractor of Food in the Last Year: Never true    Ran Out of Food in the Last Year: Never true  Transportation Needs: No Transportation Needs (03/28/2024)   Epic    Lack of Transportation (Medical): No    Lack of Transportation (Non-Medical): No  Physical Activity: Not on file  Stress: Not on file  Social Connections: Moderately Isolated (03/28/2024)   Social Connection and Isolation Panel    Frequency of Communication with Friends and Family: Three times a week    Frequency of Social Gatherings with Friends and Family: Three times a week    Attends Religious Services: 1 to 4 times per year    Active Member of Clubs or Organizations: No    Attends Banker Meetings: Never    Marital Status: Widowed  Intimate Partner Violence: Not At Risk (03/28/2024)   Epic    Fear of Current or Ex-Partner: No    Emotionally Abused: No    Physically Abused: No    Sexually Abused: No  Depression (PHQ2-9): Not on file  Alcohol Screen: Not on file  Housing: Low Risk (03/28/2024)   Epic    Unable to Pay for Housing in the Last Year: No    Number of Times Moved  in the Last Year: 0    Homeless in the Last Year: No  Utilities: Not At Risk (03/28/2024)   Epic    Threatened with loss of utilities: No  Health Literacy: Not on file     Review of Systems: Review of Systems  Constitutional:  Negative for chills, fever and malaise/fatigue.  HENT:  Negative for congestion, hearing loss and tinnitus.   Eyes:  Negative for blurred vision and double vision.  Respiratory:  Positive for shortness of breath. Negative for cough and sputum production.   Cardiovascular:  Positive for leg swelling. Negative for chest pain, palpitations and orthopnea.  Gastrointestinal:  Negative for diarrhea, nausea and vomiting.  Genitourinary:  Negative for dysuria, frequency and urgency.  Musculoskeletal:  Negative for myalgias.  Skin:  Negative for itching and rash.  Neurological:  Negative for dizziness and focal  weakness.  Endo/Heme/Allergies:  Negative for polydipsia. Does not bruise/bleed easily.  Psychiatric/Behavioral:  The patient is not nervous/anxious.      Vital Signs: Blood pressure (!) 157/57, pulse 74, temperature 97.7 F (36.5 C), resp. rate 18, height 5' 10 (1.778 m), weight 100.9 kg, SpO2 96%.  Weight trends: Filed Weights   03/27/24 2117 03/28/24 1527 03/29/24 0500  Weight: 100.5 kg 100.4 kg 100.9 kg     Physical Exam: General: No acute distress  Head: Normocephalic, atraumatic. Moist oral mucosal membranes  Eyes: Anicteric  Neck: Supple  Lungs:  Clear to auscultation, normal effort  Heart: S1S2 no rubs  Abdomen:  Soft, nontender, bowel sounds present  Extremities: 1+ peripheral edema.  Neurologic: Awake, alert, following commands  Skin: No acute rash  Access: No hemodialysis access    Lab results: Basic Metabolic Panel: Recent Labs  Lab 03/27/24 1456 03/27/24 2054 03/28/24 0345 03/29/24 0136  NA 135  --  137 138  K 5.0  --  4.0 4.0  CL 101  --  103 101  CO2 23  --  21* 23  GLUCOSE 127*  --  149* 145*  BUN 55*  --  56* 56*  CREATININE 3.24* 3.23* 3.41* 3.77*  CALCIUM 9.3  --  8.5* 9.0  MG  --   --   --  2.1    Liver Function Tests: Recent Labs  Lab 03/29/24 0136  AST 12*  ALT 21  ALKPHOS 70  BILITOT 0.5  PROT 6.4*  ALBUMIN 3.6   No results for input(s): LIPASE, AMYLASE in the last 168 hours. No results for input(s): AMMONIA in the last 168 hours.  CBC: Recent Labs  Lab 03/27/24 1456 03/27/24 2054 03/29/24 0136  WBC 14.7* 16.6* 17.6*  HGB 12.9* 13.5 12.7*  HCT 39.3 39.9 36.8*  MCV 93.6 92.1 90.9  PLT 364 357 317    Cardiac Enzymes: No results for input(s): CKTOTAL, CKMB, CKMBINDEX, TROPONINI in the last 168 hours.  BNP: Invalid input(s): POCBNP  CBG: Recent Labs  Lab 03/28/24 1407 03/28/24 1626 03/28/24 2029 03/29/24 0819 03/29/24 1159  GLUCAP 223* 156* 162* 157* 166*    Microbiology: Results for  orders placed or performed during the hospital encounter of 03/14/21  Urine Culture     Status: None   Collection Time: 03/14/21 12:39 PM   Specimen: PATH Cytology Urine  Result Value Ref Range Status   Specimen Description   Final    URINE, RANDOM Performed at [facility], [address] [place], [place] [address]    Special Requests   Final    NONE Performed at [facility], [address], [place], [place] [address]    Culture   Final    NO GROWTH Performed at [facility], [address], [place], [place] [address]    Report Status 03/15/2021 FINAL  Final    Coagulation Studies: Recent Labs    03/28/24 1719  LABPROT 14.2  INR 1.0    Urinalysis: No results for input(s): COLORURINE, LABSPEC, PHURINE, GLUCOSEU, HGBUR, BILIRUBINUR, KETONESUR, PROTEINUR, UROBILINOGEN, NITRITE, LEUKOCYTESUR in the last 72 hours.  Invalid input(s): APPERANCEUR    Imaging: ECHOCARDIOGRAM COMPLETE Result Date: 03/29/2024    ECHOCARDIOGRAM REPORT   Patient Name:   Esaias C Norris Date of Exam: 03/29/2024 Medical Rec #:  [id number]      Height:       70.0 in Accession #:    [id number]     Weight:       222.4 lb Date of Birth:  16-Feb-1933       BSA:          2.184 m Patient Age:    89 years       BP:           162/73 mmHg Patient Gender: M              HR:           80 bpm. Exam Location:  [facility] Procedure: 2D Echo, Cardiac Doppler and Color Doppler (Both Spectral and Color            Flow Doppler were utilized during procedure). Indications:     CHF-Acute Diastolic I50.31  History:         Patient has no prior history of Echocardiogram examinations.                  Risk Factors:Hypertension and Diabetes.  Sonographer:     Bari Roar Referring Phys:  [id number] [facility] Diagnosing Phys: Shelda Bruckner MD IMPRESSIONS  1. Left ventricular ejection fraction, by estimation, is 55 to 60%. The left ventricle has normal function. The left ventricle has no  regional wall motion abnormalities. There is mild concentric left ventricular hypertrophy. Indeterminate diastolic filling due to E-A fusion.  2. Right ventricular systolic function is normal. The right ventricular size is normal.  3. Left atrial size was mildly dilated.  4. The mitral valve is normal in structure. Trivial mitral valve regurgitation. No evidence of mitral stenosis.  5. The aortic valve was not well visualized. There is moderate calcification of the aortic valve. Aortic valve regurgitation is trivial. Aortic valve sclerosis/calcification is present, without any evidence of aortic stenosis. Comparison(s): No prior Echocardiogram. Conclusion(s)/Recommendation(s): Otherwise normal echocardiogram, with minor abnormalities described in the report. FINDINGS  Left Ventricle: Left ventricular ejection fraction, by estimation, is 55 to 60%. The left ventricle has normal function. The left ventricle has no regional wall motion abnormalities. The left ventricular internal cavity size was normal in size. There is  mild concentric left ventricular hypertrophy. Abnormal (paradoxical) septal motion, consistent with left bundle branch block. Indeterminate diastolic filling due to E-A fusion. Right Ventricle: The right ventricular size is normal. No increase in right ventricular wall thickness. Right ventricular systolic function is normal. Left Atrium: Left atrial size was mildly dilated. Right Atrium: Right atrial size was normal in size. Pericardium: There is no evidence of pericardial effusion. Mitral Valve: The mitral valve is normal in structure. Trivial mitral valve regurgitation. No evidence of mitral valve stenosis. MV peak gradient, 6.2 mmHg. The mean mitral valve  gradient is 2.0 mmHg. Tricuspid Valve: The tricuspid valve is normal in structure. Tricuspid valve regurgitation is trivial. No evidence of tricuspid stenosis. Aortic Valve: The aortic valve was not well visualized. There is moderate calcification  of the aortic valve. Aortic valve regurgitation is trivial. Aortic regurgitation PHT measures 688 msec. Aortic valve sclerosis/calcification is present, without any evidence of aortic stenosis. Aortic valve mean gradient measures 3.0 mmHg. Aortic valve peak gradient measures 6.2 mmHg. Aortic valve area, by VTI measures 3.04 cm. Pulmonic Valve: The pulmonic valve was not well visualized. Pulmonic valve regurgitation is not visualized. No evidence of pulmonic stenosis. Aorta: The aortic root, ascending aorta and aortic arch are all structurally normal, with no evidence of dilitation or obstruction. Venous: The inferior vena cava was not well visualized. IAS/Shunts: There is right bowing of the interatrial septum, suggestive of elevated left atrial pressure. The atrial septum is grossly normal.  LEFT VENTRICLE PLAX 2D LVIDd:         5.10 cm      Diastology LVIDs:         3.70 cm      LV e' medial:    5.22 cm/s LV PW:         1.30 cm      LV E/e' medial:  11.4 LV IVS:        1.50 cm      LV e' lateral:   7.83 cm/s LVOT diam:     2.10 cm      LV E/e' lateral: 7.6 LV SV:         70 LV SV Index:   32 LVOT Area:     3.46 cm  LV Volumes (MOD) LV vol d, MOD A2C: 110.0 ml LV vol d, MOD A4C: 109.0 ml LV vol s, MOD A2C: 53.7 ml LV vol s, MOD A4C: 48.7 ml LV SV MOD A2C:     56.3 ml LV SV MOD A4C:     109.0 ml LV SV MOD BP:      61.2 ml RIGHT VENTRICLE RV Basal diam:  3.70 cm RV Mid diam:    3.10 cm RV S prime:     14.90 cm/s TAPSE (M-mode): 3.1 cm LEFT ATRIUM             Index        RIGHT ATRIUM           Index LA diam:        4.00 cm 1.83 cm/m   RA Area:     15.50 cm LA Vol (A2C):   71.7 ml 32.83 ml/m  RA Volume:   37.20 ml  17.03 ml/m LA Vol (A4C):   68.4 ml 31.32 ml/m LA Biplane Vol: 70.3 ml 32.19 ml/m  AORTIC VALVE                    PULMONIC VALVE AV Area (Vmax):    3.33 cm     PV Vmax:        1.43 m/s AV Area (Vmean):   2.91 cm     PV Peak grad:   8.2 mmHg AV Area (VTI):     3.04 cm     RVOT Peak grad: 5 mmHg AV  Vmax:           125.00 cm/s AV Vmean:          87.600 cm/s AV VTI:            0.230 m AV  Peak Grad:      6.2 mmHg AV Mean Grad:      3.0 mmHg LVOT Vmax:         120.00 cm/s LVOT Vmean:        73.500 cm/s LVOT VTI:          0.202 m LVOT/AV VTI ratio: 0.88 AI PHT:            688 msec  AORTA Ao Root diam: 3.00 cm Ao Asc diam:  3.40 cm MITRAL VALVE                TRICUSPID VALVE MV Area (PHT): 5.27 cm     TR Peak grad:   24.8 mmHg MV Area VTI:   2.29 cm     TR Vmax:        249.00 cm/s MV Peak grad:  6.2 mmHg MV Mean grad:  2.0 mmHg     SHUNTS MV Vmax:       1.24 m/s     Systemic VTI:  0.20 m MV Vmean:      71.5 cm/s    Systemic Diam: 2.10 cm MV Decel Time: 144 msec MV E velocity: 59.40 cm/s MV A velocity: 135.00 cm/s MV E/A ratio:  0.44 MV A Prime:    13.7 cm/s Shelda Bruckner MD Electronically signed by Shelda Bruckner MD Signature Date/Time: 03/29/2024/10:52:45 AM    Final    DG Chest 2 View Result Date: 03/27/2024 CLINICAL DATA:  Chest pain EXAM: CHEST - 2 VIEW COMPARISON:  May 01, 2014 FINDINGS: Mild cardiomegaly is noted. Bilateral pulmonary edema is noted with small pleural effusions. Bony thorax is unremarkable. IMPRESSION: Bilateral pulmonary edema is noted with small pleural effusions. Electronically Signed   By: Lynwood Landy Raddle M.D.   On: 03/27/2024 15:48   CT Head Wo Contrast Result Date: 03/27/2024 EXAM: CT HEAD WITHOUT 03/27/2024 03:23:11 PM TECHNIQUE: CT of the head was performed without the administration of intravenous contrast. Automated exposure control, iterative reconstruction, and/or weight based adjustment of the mA/kV was utilized to reduce the radiation dose to as low as reasonably achievable. COMPARISON: 05/01/2014. CLINICAL HISTORY: HA / htn FINDINGS: BRAIN AND VENTRICLES: No acute intracranial hemorrhage. No mass effect or midline shift. No extra-axial fluid collection. No evidence of acute infarct. No hydrocephalus. Tiny 5 mm fatty lesion along left aspect of falx,  unchanged since 2016. Mild chronic microvascular ischemic disease. Calcific atherosclerosis. ORBITS: No acute abnormality. SINUSES AND MASTOIDS: No acute abnormality. SOFT TISSUES AND SKULL: No acute skull fracture. No acute soft tissue abnormality. IMPRESSION: 1. No acute intracranial abnormality. Electronically signed by: Donnice Mania MD 03/27/2024 03:32 PM EST RP Workstation: [id number]     Assessment & Plan: Pt is a 89 y.o. male with a PMHx of diabetes mellitus type 2, hypertension, glaucoma, lumbar spinal stenosis, chronic kidney disease stage IV with history of right-sided nephrectomy, anemia of chronic kidney disease, BPH who was admitted to [facility] on 03/27/2024 for evaluation of worsening lower extremity edema and dyspnea with exertion.   1.  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  Baseline eGFR 17.  eGFR currently 15 now.  Suspect renal function altered now due to cardiorenal syndrome.  No urgent indication for dialysis however we did discuss the future possibility of renal placement therapy given relatively low renal function and current kidney status.  Patient and family's questions were answered.  Agree with holding Lasix  at this time.  We will give gentle hydration for 1 day with 0.9 normal saline at 40 cc/h.  2.  Hypertension.  Continue amlodipine , carvedilol , clonidine , hydralazine .  3.  Anemia of chronic kidney disease.  Hemoglobin 12.7.  No immediate need for Epogen.  Continue to monitor CBC.       [1] No Known Allergies

## 2024-03-29 NOTE — Progress Notes (Signed)
 Progress Note  Patient Name: Timothy Duke Date of Encounter: 03/29/2024  Primary Cardiologist: New - consult by Dr. Lonni, MD   Subjective   Feels like his SOB is about the same. No chest pain or palpitations. Documented UOP 1.5 L for the past 24 hours, net - 4.4 L for the admission with incomplete ins and outs. Weight trend 100.4 kg to 100.9 kg over the past 24 hours. Renal function slight worse today. BP remains poorly controlled. Echo pending.   Inpatient Medications    Scheduled Meds:  amLODipine   10 mg Oral Daily   aspirin  EC  81 mg Oral Daily   carvedilol   12.5 mg Oral BID WC   empagliflozin   10 mg Oral Daily   furosemide   40 mg Intravenous Q12H   hydrALAZINE   50 mg Oral TID   Influenza vac split trivalent PF  0.5 mL Intramuscular Tomorrow-1000   insulin  aspart  0-5 Units Subcutaneous QHS   insulin  aspart  0-9 Units Subcutaneous TID WC   latanoprost   1 drop Both Eyes QHS   multivitamin with minerals  1 tablet Oral Daily   sodium chloride  flush  3 mL Intravenous Q12H   timolol   1 drop Both Eyes BID   Continuous Infusions:  heparin  1,300 Units/hr (03/28/24 2300)   PRN Meds: acetaminophen , ALPRAZolam , hydrALAZINE , labetalol , ondansetron  (ZOFRAN ) IV, sodium chloride  flush, traZODone    Vital Signs    Vitals:   03/29/24 0200 03/29/24 0416 03/29/24 0500 03/29/24 0827  BP:  (!) 162/73  (!) 187/90  Pulse:  73  73  Resp: 15 19  20   Temp:  98.7 F (37.1 C)  98.1 F (36.7 C)  TempSrc:      SpO2:  96%  97%  Weight:   100.9 kg   Height:        Intake/Output Summary (Last 24 hours) at 03/29/2024 0948 Last data filed at 03/28/2024 2300 Gross per 24 hour  Intake 278.95 ml  Output 1525 ml  Net -1246.05 ml   Filed Weights   03/27/24 2117 03/28/24 1527 03/29/24 0500  Weight: 100.5 kg 100.4 kg 100.9 kg    Telemetry    SR with frequent PACs and multifocal PVCs - Personally Reviewed  ECG    No new tracings - Personally Reviewed  Physical Exam   GEN:  No acute distress.   Neck: No JVD. Cardiac: RRR with frequent ectopy, no murmurs, rubs, or gallops.  Respiratory: Diminished breath sounds along the bases bilaterally, though improving.  GI: Soft, nontender, non-distended.   MS: No lower extremity edema; No deformity. Neuro:  Alert and oriented x 3; Nonfocal.  Psych: Normal affect.  Labs    Chemistry Recent Labs  Lab 03/27/24 1456 03/27/24 2054 03/28/24 0345 03/29/24 0136  NA 135  --  137 138  K 5.0  --  4.0 4.0  CL 101  --  103 101  CO2 23  --  21* 23  GLUCOSE 127*  --  149* 145*  BUN 55*  --  56* 56*  CREATININE 3.24* 3.23* 3.41* 3.77*  CALCIUM 9.3  --  8.5* 9.0  PROT  --   --   --  6.4*  ALBUMIN  --   --   --  3.6  AST  --   --   --  12*  ALT  --   --   --  21  ALKPHOS  --   --   --  70  BILITOT  --   --   --  0.5  GFRNONAA 18* 18* 17* 15*  ANIONGAP 11  --  13 14     Hematology Recent Labs  Lab 03/27/24 1456 03/27/24 2054 03/29/24 0136  WBC 14.7* 16.6* 17.6*  RBC 4.20* 4.33 4.05*  HGB 12.9* 13.5 12.7*  HCT 39.3 39.9 36.8*  MCV 93.6 92.1 90.9  MCH 30.7 31.2 31.4  MCHC 32.8 33.8 34.5  RDW 13.5 13.2 13.2  PLT 364 357 317    Cardiac EnzymesNo results for input(s): TROPONINI in the last 168 hours. No results for input(s): TROPIPOC in the last 168 hours.   BNP Recent Labs  Lab 03/27/24 1456  PROBNP 15,059.0*     DDimer No results for input(s): DDIMER in the last 168 hours.   Radiology    DG Chest 2 View Result Date: 03/27/2024 IMPRESSION: Bilateral pulmonary edema is noted with small pleural effusions. Electronically Signed   By: Lynwood Landy Raddle M.D.   On: 03/27/2024 15:48   CT Head Wo Contrast Result Date: 03/27/2024 IMPRESSION: 1. No acute intracranial abnormality. Electronically signed by: Donnice Mania MD 03/27/2024 03:32 PM EST RP Workstation: [id number]    Cardiac Studies   2D echo 10/30/2021 [facility]): Summary   1. The left ventricle is normal in size with mildly increased wall   thickness.   2. The left ventricular systolic function is normal, LVEF is visually  estimated at 55-60%.    3. Aortic sclerosis.    4. The right ventricle is normal in size, with normal systolic function. __________  2D echo pending  Patient Profile     89 y.o. 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 Lawence.   Assessment & Plan    1. Acute CHF:  - Type and chronicity are unclear  - This is a multifactorial process including hypertensive emergency and advanced renal disease with possible further cardiac involvement pending evaluation  - Renal function has worsened with IV diuresis, remains on IV Lasix  40 mg bid - Recommend nephrology involvement to assist in diuresis, defer consult to internal medicine  - Obtain echo with further recommendations pending results  - Daily weights with strict I's and O's   2. Elevated high-sensitivity troponin:  - Never with chest pain - Mildly elevated and flat trending, not consistent with ACS  - Likely supply/demand ischemia in the setting of volume overload with poor renal clearance with significant underlying renal dysfunction  - Given history of A-fib, he was started on a heparin  drip, continue until echo is complete or until he has completed 48 hours of IV heparin   - Obtain echo with further recommendations pending  - Not an invasive ischemic evaluation candidate with advanced renal disease   3. Hypertensive emergency:  - Contributing to presentation  - Blood pressure remains poorly controlled, though improving  - Currently has amlodipine  10 mg, clonidine  0.1 mg daily, and Coreg  (in place of metoprolol ) 12.5 mg bid - Escalate pharmacotherapy as indicated once he has received morning medications  4. PAF:  - Initially diagnosed in  10/2021  - Maintaining sinus rhythm  - Has not been maintained on anticoagulation as an outpatient  - CHA2DS2-VASc at least 6 (CHF, HTN, age x 2, DM, vascular disease)  - It is unclear if this was a lone episode in 2023 in the postoperative setting or  if he has continued to have paroxysms of A-fib (was asymptomatic while in documented A-fib in 2023) - Monitor on telemetry - If Afib recurs, would recommend anticoagulation long term unless there is a contraindication   5. CKD stage IV:  - Contributing to volume overload  - With progressive decline in renal function, recommended nephrology input to assist with diuresis  6. PVCs: - Coreg  as above - Magnesium and potassium at goal - Echo pending      For questions or updates, please contact [facility] Please consult [email or web address] for contact info under Cardiology/STEMI.    Signed, Bernardino Bring, PA-C [facility] Pager: [phone] 03/29/2024, 9:48 AM

## 2024-03-29 NOTE — Consult Note (Signed)
 PHARMACY - ANTICOAGULATION CONSULT NOTE  Pharmacy Consult for heparin  infusion Indication: atrial fibrillation  Allergies[1]  Patient Measurements: Height: 5' 10 (177.8 cm) Weight: 100.4 kg (221 lb 5.5 oz) IBW/kg (Calculated) : 73 HEPARIN  DW (KG): 94  Vital Signs: Temp: 98.3 F (36.8 C) (12/14 0044) Temp Source: Oral (12/13 2045) BP: 179/61 (12/14 0147) Pulse Rate: 79 (12/14 0147)  Labs: Recent Labs    03/27/24 1456 03/27/24 2054 03/28/24 0345 03/28/24 1719 03/29/24 0136  HGB 12.9* 13.5  --   --  12.7*  HCT 39.3 39.9  --   --  36.8*  PLT 364 357  --   --  317  APTT  --   --   --  23*  --   LABPROT  --   --   --  14.2  --   INR  --   --   --  1.0  --   HEPARINUNFRC  --   --   --   --  0.33  CREATININE 3.24* 3.23* 3.41*  --  3.77*   Estimated Creatinine Clearance: 15.8 mL/min (A) (by C-G formula based on SCr of 3.77 mg/dL (H)).  Medical History: Past Medical History:  Diagnosis Date   BPH (benign prostatic hyperplasia)    Chronic kidney disease (CKD), stage IV (severe) (HCC)    Degenerative lumbar spinal stenosis    Diabetes mellitus without complication (HCC)    Glaucoma    Hypertension    PAF (paroxysmal atrial fibrillation) (HCC)    Urothelial cancer (HCC)    Medications:  No AC prior to admission  Assessment: PMH of DM, HTN, glaucoma, lumbar spine stenosis, history of A-fib not on anticoagulation. Pharmacy received consult to start heparin  infusion for Afib.   Patient on enoxaparin  Dvt prophylaxis with last dose 12/12 @ 2111. Baseline CBC appropriate to start heparin  infusion. Baseline INR and aPTT ordered.   Goal of Therapy:  Heparin  level 0.3-0.7 units/ml Monitor platelets by anticoagulation protocol: Yes  12/14 0136 HL 0.33, therapeutic x 1   Plan:  Continue heparin  infusion at 1300 units/hr Recheck heparin  level in 8 hours to confirm Continue to monitor H&H and platelets  Rankin CANDIE Dills, PharmD, [facility] 03/29/2024 3:09 AM         [1]  No Known Allergies

## 2024-03-29 NOTE — Progress Notes (Signed)
 Progress Note   Patient: Timothy Duke FMW:[id number] DOB: 01-16-1933 DOA: 03/27/2024     2 DOS: the patient was seen and examined on 03/29/2024   Brief hospital course: HPI: LATRAVION GRAVES is a 89 y.o. male w/PMH of DM, HTN, glaucoma, lumbar spine stenosis, history of A-fib not on anticoagulation  who presented to the ER with acute onset of worsening dyspnea with associated orthopnea and paroxysmal nocturnal dyspnea since last night.  The patient admitted to worsening lower extremity edema and dyspnea on exertion.  He has been having dry cough and wheezing.  He denied any chest pain or palpitations.  He was diagnosed with acute bronchitis on 12/1.  He denied any nausea or vomiting or abdominal pain.  No dysuria, oliguria or hematuria or flank pain.   ED Course: When he came to the ER, BP was 121/82 with otherwise normal vital signs.  Labs revealed a BUN of 55 with creatinine 3.24 and proBNP 15,059, high-sensitivity troponin was 54 and later 53.  CBC showed leukocytosis 14.7 with hemoglobin 12.9 hematocrit 39.3. EKG as reviewed by me : EKG showed sinus rhythm with a rate of 69 with occasional premature ventricular complexes, left axis deviation and septal Q waves. Imaging: 2 view chest x-ray showed bilateral pulmonary edema with small pleural effusions. Noncontrast head CT scan showed no acute intracranial normalities.   The patient was given 80 mg of IV Lasix .  He will be admitted to a telemetry bed for further evaluation and management.  Assessment and Plan:  Acute pulmonary edema (HCC), new onset of congestive heart failure - This could be related to hypertensive urgency, history of atrial fibrillation and advanced renal disease. - Cardiology consult appreciated.  Cardiology advised to stop heparin  drip and diuretics at this point.  May consider giving diuretics if needed per nephrology. - 2D echo done pending result. - Elevated troponin are likely related to his acute CHF and AKI. -  Nephrology called awaiting consult  Paroxysmal atrial fibrillation - Has not been on anticoagulation, documented in 2023 postoperatively after having urology procedure - Cardiology advised to stop heparin  drip and no anticoagulation needed at this point as there was no documented atrial fibrillation since 2023   Hypertensive urgency - We will continue antihypertensive therapy while holding off nephrotoxins. - Continue hydralazine  50 3 times daily, Coreg  12.5 twice daily, amlodipine  10 mg daily. - Continue on as needed IV labetalol  and as needed IV hydralazine . - This is likely contributing to #1.   AKI (acute kidney injury) - This could be related to #1 due to renal hypoperfusion. - Stop diuretics per cardiology - Will avoid nephrotoxins. - Nephrology consult have been requested   BPH (benign prostatic hyperplasia) - Continue Proscar .   Type 2 diabetes mellitus without complications (HCC) - The patient will be placed on supplemental coverage with NovoLog . - Will continue Jardiance  hold glipizide        Subjective: Patient feels better no symptoms today.  Denies any chest pain or shortness of  Physical Exam: Vitals:   03/29/24 0416 03/29/24 0500 03/29/24 0827 03/29/24 1157  BP: (!) 162/73  (!) 187/90 (!) 157/57  Pulse: 73  73 74  Resp: 19  20 18   Temp: 98.7 F (37.1 C)  98.1 F (36.7 C) 97.7 F (36.5 C)  TempSrc:      SpO2: 96%  97% 96%  Weight:  100.9 kg    Height:       Constitutional: Alert, awake, calm, comfortable HEENT: Neck supple Respiratory:  Clear to auscultation B/L, no wheezing, no rales.  Cardiovascular: Regular rate and rhythm, no murmurs / rubs / gallops. No extremity edema. 2+ pedal pulses. No carotid bruits.  Abdomen: Soft, no tenderness, Bowel sounds positive.  Musculoskeletal: no clubbing / cyanosis. Good ROM, no contractures. Normal muscle tone.  Skin: no rashes, lesions, ulcers. Neurologic: CN 2-12 grossly intact. Sensation intact, No focal  deficit identified Psychiatric: Alert and oriented x 3. Normal mood.    Data Reviewed:  Reviewed kidney function worsened slightly  Family Communication: None available  Disposition: Status is: Inpatient Remains inpatient appropriate because: Ongoing recovery from AKI and CHF  Planned Discharge Destination: Home    Time spent: 35 minutes  Author: Nena Rebel, MD 03/29/2024 12:32 PM  For on call review [email or web address].

## 2024-03-30 DIAGNOSIS — N184 Chronic kidney disease, stage 4 (severe): Secondary | ICD-10-CM | POA: Diagnosis not present

## 2024-03-30 DIAGNOSIS — I161 Hypertensive emergency: Secondary | ICD-10-CM

## 2024-03-30 DIAGNOSIS — I493 Ventricular premature depolarization: Secondary | ICD-10-CM | POA: Diagnosis not present

## 2024-03-30 DIAGNOSIS — I480 Paroxysmal atrial fibrillation: Secondary | ICD-10-CM | POA: Diagnosis not present

## 2024-03-30 DIAGNOSIS — J810 Acute pulmonary edema: Secondary | ICD-10-CM | POA: Diagnosis not present

## 2024-03-30 DIAGNOSIS — R7989 Other specified abnormal findings of blood chemistry: Secondary | ICD-10-CM | POA: Diagnosis not present

## 2024-03-30 DIAGNOSIS — I5033 Acute on chronic diastolic (congestive) heart failure: Secondary | ICD-10-CM | POA: Diagnosis not present

## 2024-03-30 LAB — GLUCOSE, CAPILLARY
Glucose-Capillary: 182 mg/dL — ABNORMAL HIGH (ref 70–99)
Glucose-Capillary: 168 mg/dL — ABNORMAL HIGH (ref 70–99)
Glucose-Capillary: 214 mg/dL — ABNORMAL HIGH (ref 70–99)
Glucose-Capillary: 176 mg/dL — ABNORMAL HIGH (ref 70–99)

## 2024-03-30 LAB — URINALYSIS, ROUTINE W REFLEX MICROSCOPIC
Bilirubin Urine: NEGATIVE
Glucose, UA: >=500 mg/dL — AB
Hgb urine dipstick: NEGATIVE
Ketones, ur: NEGATIVE mg/dL
Leukocytes,Ua: NEGATIVE
Nitrite: NEGATIVE
Protein, ur: >=300 mg/dL — AB
Specific Gravity, Urine: 1.014 (ref 1.005–1.030)
Squamous Epithelial / HPF: 0 /HPF (ref 0–5)
pH: 6 (ref 5.0–8.0)

## 2024-03-30 LAB — CBC
HCT: 35.4 % — ABNORMAL LOW (ref 39.0–52.0)
Hemoglobin: 11.7 g/dL — ABNORMAL LOW (ref 13.0–17.0)
MCH: 31.1 pg (ref 26.0–34.0)
MCHC: 33.1 g/dL (ref 30.0–36.0)
MCV: 94.1 fL (ref 80.0–100.0)
Platelets: 301 K/uL (ref 150–400)
RBC: 3.76 MIL/uL — ABNORMAL LOW (ref 4.22–5.81)
RDW: 13.6 % (ref 11.5–15.5)
WBC: 15.7 K/uL — ABNORMAL HIGH (ref 4.0–10.5)
nRBC: 0 % (ref 0.0–0.2)

## 2024-03-30 LAB — BASIC METABOLIC PANEL WITH GFR
Anion gap: 12 (ref 5–15)
BUN: 59 mg/dL — ABNORMAL HIGH (ref 8–23)
CO2: 25 mmol/L (ref 22–32)
Calcium: 8.6 mg/dL — ABNORMAL LOW (ref 8.9–10.3)
Chloride: 100 mmol/L (ref 98–111)
Creatinine, Ser: 4.05 mg/dL — ABNORMAL HIGH (ref 0.61–1.24)
GFR, Estimated: 13 mL/min — ABNORMAL LOW (ref >60)
Glucose, Bld: 222 mg/dL — ABNORMAL HIGH (ref 70–99)
Potassium: 4 mmol/L (ref 3.5–5.1)
Sodium: 137 mmol/L (ref 135–145)

## 2024-03-30 LAB — ALBUMIN: Albumin: 3.6 g/dL (ref 3.5–5.0)

## 2024-03-30 MED ORDER — NITROGLYCERIN 2 % TD OINT
2.0000 [in_us] | TOPICAL_OINTMENT | Freq: Four times a day (QID) | TRANSDERMAL | Status: AC
  Administered 2024-03-30: 07:00:00 2 [in_us] via TOPICAL
  Filled 2024-03-30: qty 2

## 2024-03-30 MED ORDER — HYDRALAZINE HCL 50 MG PO TABS
100.0000 mg | ORAL_TABLET | Freq: Three times a day (TID) | ORAL | Status: DC
  Administered 2024-03-30 – 2024-04-01 (×6): 100 mg via ORAL
  Filled 2024-03-30 (×6): qty 2

## 2024-03-30 MED ORDER — SODIUM CHLORIDE 0.9 % IV SOLN: INTRAVENOUS | Status: AC

## 2024-03-30 NOTE — Progress Notes (Addendum)
 Progress Note   Patient: Timothy Duke FMW:[id number] DOB: 16-Oct-1932 DOA: 03/27/2024     3 DOS: the patient was seen and examined on 03/30/2024   Brief hospital course: HPI: Timothy Duke is a 89 y.o. male w/PMH of DM, HTN, glaucoma, lumbar spine stenosis, history of A-fib not on anticoagulation  who presented to the ER with acute onset of worsening dyspnea with associated orthopnea and paroxysmal nocturnal dyspnea since last night.  The patient admitted to worsening lower extremity edema and dyspnea on exertion.  He has been having dry cough and wheezing.  He denied any chest pain or palpitations.  He was diagnosed with acute bronchitis on 12/1.  He denied any nausea or vomiting or abdominal pain.  No dysuria, oliguria or hematuria or flank pain.   ED Course: When he came to the ER, BP was 121/82 with otherwise normal vital signs.  Labs revealed a BUN of 55 with creatinine 3.24 and proBNP 15,059, high-sensitivity troponin was 54 and later 53.  CBC showed leukocytosis 14.7 with hemoglobin 12.9 hematocrit 39.3. EKG as reviewed by me : EKG showed sinus rhythm with a rate of 69 with occasional premature ventricular complexes, left axis deviation and septal Q waves. Imaging: 2 view chest x-ray showed bilateral pulmonary edema with small pleural effusions. Noncontrast head CT scan showed no acute intracranial normalities.   The patient was given 80 mg of IV Lasix .  He will be admitted to a telemetry bed for further evaluation and management.  12/14: Nephrology evaluated patient and advised to stop diuretics and start on gentle hydration, IVF normal saline at 40 cc/h  Assessment and Plan:  Acute pulmonary edema (HCC), new onset of congestive heart failure - This could be related to hypertensive urgency, history of atrial fibrillation and advanced renal disease. - Cardiology consult appreciated.  Cardiology advised to stop heparin  drip and diuretics at this point due to worsening renal function. -  2D echo done with ejection fraction 55 to 60% - Elevated troponin are likely related to his acute CHF and AKI due to demand ischemia.   AKI (acute kidney injury) - This could be related to #1 due to renal hypoperfusion. - Stop diuretics per cardiology and nephrology advised for gentle hydration at 40 cc/h normal saline - Will avoid nephrotoxic drugs including diuretics and lisinopril . - Nephrology consult appreciated - Will continue to monitor kidney function  Paroxysmal atrial fibrillation - Has not been on anticoagulation, documented in 2023 postoperatively after having urology procedure - Cardiology advised to stop heparin  drip and no anticoagulation needed at this point as there was no documented atrial fibrillation since 2023   Hypertensive urgency - We will continue antihypertensive therapy while holding off nephrotoxins. - Continue hydralazine  50 3 times daily, Coreg  12.5 twice daily, amlodipine  10 mg daily. - Continue on as needed IV labetalol  and as needed IV hydralazine . - This is likely contributing to #1.     BPH (benign prostatic hyperplasia) - Continue Proscar .   Type 2 diabetes mellitus without complications (HCC) - The patient will be placed on supplemental coverage with NovoLog . - Will continue Jardiance  hold glipizide   Leukocytosis - Does not have fever - Will check urine analysis - Chest x-ray did not show any infiltrate            Subjective: Patient feels better denies any chest pain shortness of breath palpitations denies any nausea vomiting or abdominal pain  Physical Exam: Vitals:   03/30/24 0459 03/30/24 0500 03/30/24 0818 03/30/24 1212  BP: (!) 174/97  (!) 148/84 (!) 162/85  Pulse:   79 65  Resp:   16   Temp:   97.9 F (36.6 C) 97.8 F (36.6 C)  TempSrc:      SpO2:   95% 98%  Weight:  99.8 kg    Height:       Constitutional: Alert, awake, calm, comfortable HEENT: Neck supple Respiratory: Clear to auscultation B/L, no wheezing, no  rales.  Cardiovascular: Regular rate and rhythm, no murmurs / rubs / gallops. No extremity edema. 2+ pedal pulses. No carotid bruits.  Abdomen: Soft, no tenderness, Bowel sounds positive.  Musculoskeletal: no clubbing / cyanosis. Good ROM, no contractures. Normal muscle tone.  Skin: no rashes, lesions, ulcers. Neurologic: CN 2-12 grossly intact. Sensation intact, No focal deficit identified Psychiatric: Alert and oriented x 3. Normal mood.    Data Reviewed:  Reviewed creatinine is still high at 3.77 and 405  Family Communication: No family member was available today  Disposition: Status is: Inpatient Remains inpatient appropriate because: Worsening kidney function  Planned Discharge Destination: Home    Time spent: 35 minutes  Author: Cleda Imel, MD 03/30/2024 4:19 PM  For on call review [email or web address].

## 2024-03-30 NOTE — Progress Notes (Signed)
 [facility]  ROUNDING NOTE   Subjective:   Patient continues to have quite diminished renal function. Creatinine up to 4.05 with an eGFR of 13. Also anemic with hemoglobin of 11.7.  Lab Results  Component Value Date   CREATININE 4.05 (H) 03/30/2024   CREATININE 3.77 (H) 03/29/2024   CREATININE 3.41 (H) 03/28/2024     Objective:  Vital signs in last 24 hours:  Temp:  [97.8 F (36.6 C)-98.7 F (37.1 C)] 98.2 F (36.8 C) (12/15 1621) Pulse Rate:  [65-79] 69 (12/15 1621) Resp:  [16-20] 16 (12/15 0818) BP: (148-178)/(77-97) 162/89 (12/15 1621) SpO2:  [94 %-98 %] 97 % (12/15 1621) Weight:  [99.8 kg] 99.8 kg (12/15 0500)  Weight change: -0.6 kg Filed Weights   03/28/24 1527 03/29/24 0500 03/30/24 0500  Weight: 100.4 kg 100.9 kg 99.8 kg    Intake/Output: I/O last 3 completed shifts: In: 1382.8 [P.O.:780; I.V.:602.8] Out: 1225 [Urine:1225]   Intake/Output this shift:  Total I/O In: 360 [P.O.:360] Out: 250 [Urine:250]  Physical Exam: General: No acute distress  Head: Normocephalic, atraumatic. Moist oral mucosal membranes  Neck: Supple  Lungs:  Clear to auscultation, normal effort  Heart: S1S2 no rubs  Abdomen:  Soft, nontender, bowel sounds present  Extremities: Trace peripheral edema.  Neurologic: Awake, alert, following commands  Skin: No acute rash  Access: No hemodialysis access    Basic Metabolic Panel: Recent Labs  Lab 03/27/24 1456 03/27/24 2054 03/28/24 0345 03/29/24 0136 03/30/24 1038  NA 135  --  137 138 137  K 5.0  --  4.0 4.0 4.0  CL 101  --  103 101 100  CO2 23  --  21* 23 25  GLUCOSE 127*  --  149* 145* 222*  BUN 55*  --  56* 56* 59*  CREATININE 3.24* 3.23* 3.41* 3.77* 4.05*  CALCIUM 9.3  --  8.5* 9.0 8.6*  MG  --   --   --  2.1  --     Liver Function Tests: Recent Labs  Lab 03/29/24 0136 03/30/24 1038  AST 12*  --   ALT 21  --   ALKPHOS 70  --   BILITOT 0.5  --   PROT 6.4*  --   ALBUMIN 3.6 3.6   No results  for input(s): LIPASE, AMYLASE in the last 168 hours. No results for input(s): AMMONIA in the last 168 hours.  CBC: Recent Labs  Lab 03/27/24 1456 03/27/24 2054 03/29/24 0136 03/30/24 1038  WBC 14.7* 16.6* 17.6* 15.7*  HGB 12.9* 13.5 12.7* 11.7*  HCT 39.3 39.9 36.8* 35.4*  MCV 93.6 92.1 90.9 94.1  PLT 364 357 317 301    Cardiac Enzymes: No results for input(s): CKTOTAL, CKMB, CKMBINDEX, TROPONINI in the last 168 hours.  BNP: Invalid input(s): POCBNP  CBG: Recent Labs  Lab 03/29/24 1159 03/29/24 1639 03/29/24 2112 03/30/24 0819 03/30/24 1214  GLUCAP 166* 192* 216* 182* 168*    Microbiology: Results for orders placed or performed during the hospital encounter of 03/14/21  Urine Culture     Status: None   Collection Time: 03/14/21 12:39 PM   Specimen: PATH Cytology Urine  Result Value Ref Range Status   Specimen Description   Final    URINE, RANDOM Performed at [facility], [address] [place], [place] [address]    Special Requests   Final    NONE Performed at [facility], [address] [place], [place] [address]    Culture  Final    NO GROWTH Performed at [facility], [address] [place], [place] [address]    Report Status 03/15/2021 FINAL  Final    Coagulation Studies: Recent Labs    03/28/24 1719  LABPROT 14.2  INR 1.0    Urinalysis: No results for input(s): COLORURINE, LABSPEC, PHURINE, GLUCOSEU, HGBUR, BILIRUBINUR, KETONESUR, PROTEINUR, UROBILINOGEN, NITRITE, LEUKOCYTESUR in the last 72 hours.  Invalid input(s): APPERANCEUR    Imaging: US  RENAL Result Date: 03/29/2024 CLINICAL DATA:  201281 Chronic renal disease, stage IV (HCC) 201281 EXAM: RENAL / URINARY TRACT ULTRASOUND COMPLETE COMPARISON:  July 07, 2019 FINDINGS: Right Kidney: Surgically absent Left Kidney: Renal measurements: 13.9 x 6.0 x 6.1 cm = volume: 266 mL. Echogenicity is increased. There are multiple  anechoic masses noted throughout the kidney with posterior acoustic enhancement consistent with simple cysts. Largest measures approximately 4.5 cm (for which no dedicated imaging follow-up is recommended). Echogenic focus with twinkle artifact and posterior acoustic shadowing measuring 11 mm most consistent with a nonobstructing nephrolithiasis or cortical calcification. There is mild pelviectasis, favored similar compared to priors MRI. Bladder: Bladder is decompressed with circumferential wall prominence measuring approximately 5 mm. Other: None. IMPRESSION: 1. There is mild left-sided pelviectasis, favored similar compared to priors. 2. There is a 11 mm nonobstructing nephrolithiasis or cortical calcification in the left kidney. 3. Increased echogenicity of the left kidney as can be seen in medical renal disease. 4. Circumferential bladder wall prominence may be secondary to underdistention. Recommend correlation with urinalysis. Electronically Signed   By: Corean Salter M.D.   On: 03/29/2024 18:48   ECHOCARDIOGRAM COMPLETE Result Date: 03/29/2024    ECHOCARDIOGRAM REPORT   Patient Name:   Krishan C Wilber Date of Exam: 03/29/2024 Medical Rec #:  [id number]      Height:       70.0 in Accession #:    [id number]     Weight:       222.4 lb Date of Birth:  [birth year]       BSA:          2.184 m Patient Age:    89 years       BP:           162/73 mmHg Patient Gender: M              HR:           80 bpm. Exam Location:  [facility] Procedure: 2D Echo, Cardiac Doppler and Color Doppler (Both Spectral and Color            Flow Doppler were utilized during procedure). Indications:     CHF-Acute Diastolic I50.31  History:         Patient has no prior history of Echocardiogram examinations.                  Risk Factors:Hypertension and Diabetes.  Sonographer:     Bari Roar Referring Phys:  [id number] [facility] Diagnosing Phys: Shelda Bruckner MD IMPRESSIONS  1. Left ventricular ejection fraction, by estimation, is  55 to 60%. The left ventricle has normal function. The left ventricle has no regional wall motion abnormalities. There is mild concentric left ventricular hypertrophy. Indeterminate diastolic filling due to E-A fusion.  2. Right ventricular systolic function is normal. The right ventricular size is normal.  3. Left atrial size was mildly dilated.  4. The mitral valve is normal in structure. Trivial mitral valve regurgitation. No evidence of mitral stenosis.  5.  The aortic valve was not well visualized. There is moderate calcification of the aortic valve. Aortic valve regurgitation is trivial. Aortic valve sclerosis/calcification is present, without any evidence of aortic stenosis. Comparison(s): No prior Echocardiogram. Conclusion(s)/Recommendation(s): Otherwise normal echocardiogram, with minor abnormalities described in the report. FINDINGS  Left Ventricle: Left ventricular ejection fraction, by estimation, is 55 to 60%. The left ventricle has normal function. The left ventricle has no regional wall motion abnormalities. The left ventricular internal cavity size was normal in size. There is  mild concentric left ventricular hypertrophy. Abnormal (paradoxical) septal motion, consistent with left bundle branch block. Indeterminate diastolic filling due to E-A fusion. Right Ventricle: The right ventricular size is normal. No increase in right ventricular wall thickness. Right ventricular systolic function is normal. Left Atrium: Left atrial size was mildly dilated. Right Atrium: Right atrial size was normal in size. Pericardium: There is no evidence of pericardial effusion. Mitral Valve: The mitral valve is normal in structure. Trivial mitral valve regurgitation. No evidence of mitral valve stenosis. MV peak gradient, 6.2 mmHg. The mean mitral valve gradient is 2.0 mmHg. Tricuspid Valve: The tricuspid valve is normal in structure. Tricuspid valve regurgitation is trivial. No evidence of tricuspid stenosis. Aortic  Valve: The aortic valve was not well visualized. There is moderate calcification of the aortic valve. Aortic valve regurgitation is trivial. Aortic regurgitation PHT measures 688 msec. Aortic valve sclerosis/calcification is present, without any evidence of aortic stenosis. Aortic valve mean gradient measures 3.0 mmHg. Aortic valve peak gradient measures 6.2 mmHg. Aortic valve area, by VTI measures 3.04 cm. Pulmonic Valve: The pulmonic valve was not well visualized. Pulmonic valve regurgitation is not visualized. No evidence of pulmonic stenosis. Aorta: The aortic root, ascending aorta and aortic arch are all structurally normal, with no evidence of dilitation or obstruction. Venous: The inferior vena cava was not well visualized. IAS/Shunts: There is right bowing of the interatrial septum, suggestive of elevated left atrial pressure. The atrial septum is grossly normal.  LEFT VENTRICLE PLAX 2D LVIDd:         5.10 cm      Diastology LVIDs:         3.70 cm      LV e' medial:    5.22 cm/s LV PW:         1.30 cm      LV E/e' medial:  11.4 LV IVS:        1.50 cm      LV e' lateral:   7.83 cm/s LVOT diam:     2.10 cm      LV E/e' lateral: 7.6 LV SV:         70 LV SV Index:   32 LVOT Area:     3.46 cm  LV Volumes (MOD) LV vol d, MOD A2C: 110.0 ml LV vol d, MOD A4C: 109.0 ml LV vol s, MOD A2C: 53.7 ml LV vol s, MOD A4C: 48.7 ml LV SV MOD A2C:     56.3 ml LV SV MOD A4C:     109.0 ml LV SV MOD BP:      61.2 ml RIGHT VENTRICLE RV Basal diam:  3.70 cm RV Mid diam:    3.10 cm RV S prime:     14.90 cm/s TAPSE (M-mode): 3.1 cm LEFT ATRIUM             Index        RIGHT ATRIUM           Index LA  diam:        4.00 cm 1.83 cm/m   RA Area:     15.50 cm LA Vol (A2C):   71.7 ml 32.83 ml/m  RA Volume:   37.20 ml  17.03 ml/m LA Vol (A4C):   68.4 ml 31.32 ml/m LA Biplane Vol: 70.3 ml 32.19 ml/m  AORTIC VALVE                    PULMONIC VALVE AV Area (Vmax):    3.33 cm     PV Vmax:        1.43 m/s AV Area (Vmean):   2.91 cm      PV Peak grad:   8.2 mmHg AV Area (VTI):     3.04 cm     RVOT Peak grad: 5 mmHg AV Vmax:           125.00 cm/s AV Vmean:          87.600 cm/s AV VTI:            0.230 m AV Peak Grad:      6.2 mmHg AV Mean Grad:      3.0 mmHg LVOT Vmax:         120.00 cm/s LVOT Vmean:        73.500 cm/s LVOT VTI:          0.202 m LVOT/AV VTI ratio: 0.88 AI PHT:            688 msec  AORTA Ao Root diam: 3.00 cm Ao Asc diam:  3.40 cm MITRAL VALVE                TRICUSPID VALVE MV Area (PHT): 5.27 cm     TR Peak grad:   24.8 mmHg MV Area VTI:   2.29 cm     TR Vmax:        249.00 cm/s MV Peak grad:  6.2 mmHg MV Mean grad:  2.0 mmHg     SHUNTS MV Vmax:       1.24 m/s     Systemic VTI:  0.20 m MV Vmean:      71.5 cm/s    Systemic Diam: 2.10 cm MV Decel Time: 144 msec MV E velocity: 59.40 cm/s MV A velocity: 135.00 cm/s MV E/A ratio:  0.44 MV A Prime:    13.7 cm/s Shelda Bruckner MD Electronically signed by Shelda Bruckner MD Signature Date/Time: 03/29/2024/10:52:45 AM    Final      Medications:    sodium chloride       amLODipine   10 mg Oral Daily   aspirin  EC  81 mg Oral Daily   carvedilol   12.5 mg Oral BID WC   empagliflozin   10 mg Oral Daily   hydrALAZINE   100 mg Oral TID   Influenza vac split trivalent PF  0.5 mL Intramuscular Tomorrow-1000   insulin  aspart  0-5 Units Subcutaneous QHS   insulin  aspart  0-9 Units Subcutaneous TID WC   latanoprost   1 drop Both Eyes QHS   multivitamin with minerals  1 tablet Oral Daily   sodium chloride  flush  3 mL Intravenous Q12H   timolol   1 drop Both Eyes BID   acetaminophen , ALPRAZolam , hydrALAZINE , labetalol , ondansetron  (ZOFRAN ) IV, sodium chloride  flush, traZODone   Assessment/ Plan:  89 y.o. male with a PMHx of diabetes mellitus type 2, hypertension, glaucoma, lumbar spinal stenosis, chronic kidney disease stage IV with history of right-sided nephrectomy, anemia of chronic kidney disease, BPH who  was admitted to [facility] on 03/27/2024 for evaluation of worsening  lower extremity edema and dyspnea with exertion.    1.  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  Baseline eGFR 17. Suspect renal function altered now due to cardiorenal syndrome.  Renal ultrasound negative for hydronephrosis. Lab Results  Component Value Date   CREATININE 4.05 (H) 03/30/2024   CREATININE 3.77 (H) 03/29/2024   CREATININE 3.41 (H) 03/28/2024   Update: Renal function worse today.  Creatinine up to 4.05.  No immediate need for dialysis however this may need to be considered given worsening of renal function.  Continue 0 point and normal saline at low IV rate for now.  2.  Hypertension.  Maintain the patient on amlodipine , carvedilol , and hydralazine .  Blood pressure labile.  3.  Anemia chronic kidney disease but hemoglobin 11.7.  No immediate need for Epogen.  Continue to monitor CBC.   LOS: 3 Ubah Radke 12/15/20254:25 PM

## 2024-03-30 NOTE — Plan of Care (Signed)
   Problem: Education: Goal: Ability to describe self-care measures that may prevent or decrease complications (Diabetes Survival Skills Education) will improve Outcome: Progressing Goal: Individualized Educational Video(s) Outcome: Progressing

## 2024-03-30 NOTE — Plan of Care (Signed)

## 2024-03-30 NOTE — Progress Notes (Signed)
 Rounding Note   Patient Name: Timothy Duke Date of Encounter: 03/30/2024  [facility] Cardiologist: None   Subjective Patient reports feeling well with improvement in dyspnea.  Patient asking when he can go home.  No chest pain.  Still net -4.1 L since admission.  Echo yesterday demonstrated normal LV systolic function with EF 55 to 60%.  Kidney function slightly worse today.  Started on IV fluids per nephrology.    Scheduled Meds:  amLODipine   10 mg Oral Daily   aspirin  EC  81 mg Oral Daily   carvedilol   12.5 mg Oral BID WC   empagliflozin   10 mg Oral Daily   hydrALAZINE   50 mg Oral TID   Influenza vac split trivalent PF  0.5 mL Intramuscular Tomorrow-1000   insulin  aspart  0-5 Units Subcutaneous QHS   insulin  aspart  0-9 Units Subcutaneous TID WC   latanoprost   1 drop Both Eyes QHS   multivitamin with minerals  1 tablet Oral Daily   sodium chloride  flush  3 mL Intravenous Q12H   timolol   1 drop Both Eyes BID   Continuous Infusions:  sodium chloride  1,000 mL (03/30/24 0800)   PRN Meds: acetaminophen , ALPRAZolam , hydrALAZINE , labetalol , ondansetron  (ZOFRAN ) IV, sodium chloride  flush, traZODone    Vital Signs  Vitals:   03/30/24 0411 03/30/24 0459 03/30/24 0500 03/30/24 0818  BP: (!) 178/85 (!) 174/97  (!) 148/84  Pulse: 78   79  Resp: 20   16  Temp: 98.5 F (36.9 C)   97.9 F (36.6 C)  TempSrc: Oral     SpO2: 94%   95%  Weight:   99.8 kg   Height:        Intake/Output Summary (Last 24 hours) at 03/30/2024 1208 Last data filed at 03/30/2024 0915 Gross per 24 hour  Intake 1141.09 ml  Output 775 ml  Net 366.09 ml      03/30/2024    5:00 AM 03/29/2024    5:00 AM 03/28/2024    3:27 PM  Last 3 Weights  Weight (lbs) 220 lb 0.3 oz 222 lb 7.1 oz 221 lb 5.5 oz  Weight (kg) 99.8 kg 100.9 kg 100.4 kg      Telemetry Normal sinus rhythm with occasional PACs and PVCs- Personally Reviewed  Physical Exam  GEN: No acute distress.   Neck: No  JVD Cardiac: RRR with frequent ectopy, no murmurs, rubs, or gallops.  Respiratory: Diminished at the bases bilaterally.  GI: Soft, nontender, non-distended  MS: No edema; No deformity. Neuro:  Nonfocal  Psych: Normal affect   Labs High Sensitivity Troponin:  No results for input(s): TROPONINIHS in the last 720 hours.  Recent Labs  Lab 03/27/24 1456 03/27/24 1656 03/27/24 2355 03/28/24 0345  TRNPT 54* 53* 58* 62*       Chemistry Recent Labs  Lab 03/28/24 0345 03/29/24 0136 03/30/24 1038  NA 137 138 137  K 4.0 4.0 4.0  CL 103 101 100  CO2 21* 23 25  GLUCOSE 149* 145* 222*  BUN 56* 56* 59*  CREATININE 3.41* 3.77* 4.05*  CALCIUM 8.5* 9.0 8.6*  MG  --  2.1  --   PROT  --  6.4*  --   ALBUMIN  --  3.6 3.6  AST  --  12*  --   ALT  --  21  --   ALKPHOS  --  70  --   BILITOT  --  0.5  --   GFRNONAA 17* 15* 13*  ANIONGAP  13 14 12     Lipids No results for input(s): CHOL, TRIG, HDL, LABVLDL, LDLCALC, CHOLHDL in the last 168 hours.  Hematology Recent Labs  Lab 03/27/24 2054 03/29/24 0136 03/30/24 1038  WBC 16.6* 17.6* 15.7*  RBC 4.33 4.05* 3.76*  HGB 13.5 12.7* 11.7*  HCT 39.9 36.8* 35.4*  MCV 92.1 90.9 94.1  MCH 31.2 31.4 31.1  MCHC 33.8 34.5 33.1  RDW 13.2 13.2 13.6  PLT 357 317 301   Thyroid No results for input(s): TSH, FREET4 in the last 168 hours.  BNP Recent Labs  Lab 03/27/24 1456  PROBNP 15,059.0*    DDimer No results for input(s): DDIMER in the last 168 hours.   Radiology  US  RENAL Result Date: 03/29/2024 IMPRESSION: 1. There is mild left-sided pelviectasis, favored similar compared to priors. 2. There is a 11 mm nonobstructing nephrolithiasis or cortical calcification in the left kidney. 3. Increased echogenicity of the left kidney as can be seen in medical renal disease. 4. Circumferential bladder wall prominence may be secondary to underdistention. Recommend correlation with urinalysis. Electronically Signed   By: Corean Salter M.D.   On: 03/29/2024 18:48   Cardiac Studies  03/29/2024 Echo complete 1. Left ventricular ejection fraction, by estimation, is 55 to 60%. The  left ventricle has normal function. The left ventricle has no regional  wall motion abnormalities. There is mild concentric left ventricular  hypertrophy. Indeterminate diastolic filling due to E-A fusion.   2. Right ventricular systolic function is normal. The right ventricular  size is normal.   3. Left atrial size was mildly dilated.   4. The mitral valve is normal in structure. Trivial mitral valve  regurgitation. No evidence of mitral stenosis.   5. The aortic valve was not well visualized. There is moderate  calcification of the aortic valve. Aortic valve regurgitation is trivial.  Aortic valve sclerosis/calcification is present, without any evidence of  aortic stenosis.   Patient Profile   89 y.o. 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  Assessment & Plan   Acute on chronic diastolic heart failure - Unclear chronicity - Likely multifactorial with hypertensive emergency and advanced renal disease - Echo yesterday revealed EF 55 to 60%, no RWMA, mild concentric LVH, indeterminate diastolic filling due to E-A fusion, normal RV function, and no significant valvular abnormalities - Symptoms improved with IV Lasix  but worsened renal function.  Creatinine up to 4.05 today. - GDMT limited by renal dysfunction - Continue to hold IV Lasix  - Nephrology following  Elevated troponin Hypertensive emergency - Troponin mildly elevated and flat trending, not consistent with ACS - BP 201/82 on admission - Echo with preserved LV systolic function and no RWMA - No plan for ischemic evaluation, especially given renal dysfunction -  On clonidine  PTA, would commend avoiding this moving forward given poor renal clearance and risk for rebound hypertension as well as advanced age - BP remains elevated, although overall improving - Continue amlodipine  10 mg daily and carvedilol  12.5 mg twice daily.  Will increase hydralazine  to 100 mg 3 times daily.  Paroxysmal atrial fibrillation - Has historically not been anticoagulated long-term.  Unclear frequency of events, only documented postoperatively in 2023. - CHA2DS2-VASc at least 5 - No atrial fibrillation noted this admission - Would recommend anticoagulation long-term if A-fib were to recur  CKD stage IV - Renal function continues to progressively decline.  Started on IV fluids. - Will need to monitor volume status closely - Nephrology following  PVCs - Continue telemetry monitoring - Echo with preserved LVEF - Continue carvedilol  as above  For questions or updates, please contact [facility] Please consult [email or web address] for contact info under       Signed, Lesley LITTIE Maffucci, PA-C  03/30/2024, 12:08 PM

## 2024-03-30 NOTE — Care Management Important Message (Signed)
 Important Message  Patient Details  Name: Timothy Duke MRN: [id number] Date of Birth: [birth year]   Important Message Given:  Yes - Medicare IM     Timothy Duke 03/30/2024, 2:56 PM

## 2024-03-31 ENCOUNTER — Encounter: Payer: Self-pay | Admitting: Family Medicine

## 2024-03-31 ENCOUNTER — Inpatient Hospital Stay

## 2024-03-31 DIAGNOSIS — I5033 Acute on chronic diastolic (congestive) heart failure: Secondary | ICD-10-CM

## 2024-03-31 LAB — GLUCOSE, CAPILLARY
Glucose-Capillary: 173 mg/dL — ABNORMAL HIGH (ref 70–99)
Glucose-Capillary: 312 mg/dL — ABNORMAL HIGH (ref 70–99)
Glucose-Capillary: 148 mg/dL — ABNORMAL HIGH (ref 70–99)
Glucose-Capillary: 187 mg/dL — ABNORMAL HIGH (ref 70–99)
Glucose-Capillary: 170 mg/dL — ABNORMAL HIGH (ref 70–99)

## 2024-03-31 LAB — COMPREHENSIVE METABOLIC PANEL WITH GFR
ALT: 16 U/L (ref 0–44)
AST: 16 U/L (ref 15–41)
Albumin: 3.7 g/dL (ref 3.5–5.0)
Alkaline Phosphatase: 69 U/L (ref 38–126)
Anion gap: 12 (ref 5–15)
BUN: 53 mg/dL — ABNORMAL HIGH (ref 8–23)
CO2: 24 mmol/L (ref 22–32)
Calcium: 8.9 mg/dL (ref 8.9–10.3)
Chloride: 102 mmol/L (ref 98–111)
Creatinine, Ser: 3.83 mg/dL — ABNORMAL HIGH (ref 0.61–1.24)
GFR, Estimated: 14 mL/min — ABNORMAL LOW (ref >60)
Glucose, Bld: 219 mg/dL — ABNORMAL HIGH (ref 70–99)
Potassium: 3.9 mmol/L (ref 3.5–5.1)
Sodium: 138 mmol/L (ref 135–145)
Total Bilirubin: 0.5 mg/dL (ref 0.0–1.2)
Total Protein: 6.4 g/dL — ABNORMAL LOW (ref 6.5–8.1)

## 2024-03-31 LAB — CBC
HCT: 34.4 % — ABNORMAL LOW (ref 39.0–52.0)
Hemoglobin: 11.4 g/dL — ABNORMAL LOW (ref 13.0–17.0)
MCH: 31.4 pg (ref 26.0–34.0)
MCHC: 33.1 g/dL (ref 30.0–36.0)
MCV: 94.8 fL (ref 80.0–100.0)
Platelets: 270 K/uL (ref 150–400)
RBC: 3.63 MIL/uL — ABNORMAL LOW (ref 4.22–5.81)
RDW: 13.3 % (ref 11.5–15.5)
WBC: 14.4 K/uL — ABNORMAL HIGH (ref 4.0–10.5)
nRBC: 0 % (ref 0.0–0.2)

## 2024-03-31 LAB — MAGNESIUM: Magnesium: 2.4 mg/dL (ref 1.7–2.4)

## 2024-03-31 MED ORDER — HEPARIN SODIUM (PORCINE) 5000 UNIT/ML IJ SOLN
5000.0000 [IU] | Freq: Two times a day (BID) | INTRAMUSCULAR | Status: DC
  Administered 2024-03-31 – 2024-04-01 (×2): 5000 [IU] via SUBCUTANEOUS
  Filled 2024-03-31 (×2): qty 1

## 2024-03-31 NOTE — Progress Notes (Signed)
 PROGRESS NOTE  AULTON ROUTT  FMW:[id number] DOB: Apr 20, 1932 DOA: 03/27/2024 PCP: Cleotilde Oneil FALCON, MD   HPI: ONEILL BAIS is a 89 y.o. male w/PMH of DM, HTN, glaucoma, lumbar spine stenosis, history of A-fib not on anticoagulation  who presented to the ER with acute onset of worsening dyspnea with associated orthopnea and paroxysmal nocturnal dyspnea since last night.  The patient admitted to worsening lower extremity edema and dyspnea on exertion.  He has been having dry cough and wheezing.  He denied any chest pain or palpitations.  He was diagnosed with acute bronchitis on 12/1.  He denied any nausea or vomiting or abdominal pain.  No dysuria, oliguria or hematuria or flank pain.   ED Course: When he came to the ER, BP was 121/82 with otherwise normal vital signs.  Labs revealed a BUN of 55 with creatinine 3.24 and proBNP 15,059, high-sensitivity troponin was 54 and later 53.  CBC showed leukocytosis 14.7 with hemoglobin 12.9 hematocrit 39.3. EKG as reviewed by me : EKG showed sinus rhythm with a rate of 69 with occasional premature ventricular complexes, left axis deviation and septal Q waves. Imaging: 2 view chest x-ray showed bilateral pulmonary edema with small pleural effusions. Noncontrast head CT scan showed no acute intracranial normalities.   The patient was given 80 mg of IV Lasix .  He will be admitted to a telemetry bed for further evaluation and management.  12/14: Nephrology evaluated patient and advised to stop diuretics and start on gentle hydration, IVF normal saline at 40 cc/h  12/16: I assumed care of the pt. TOC Luna, RN) messaged me via secure chat, stating patient does not have TOC need at this time.  Labs were not ordered, CMP was put in late in the morning.  Renal function does appear to be improving however not really at baseline yet.  Will defer management to nephrology.  Still pending nephrology note at this time.  Assessment & Plan:   Principal Problem:   Acute  pulmonary edema (HCC) Active Problems:   AKI (acute kidney injury)   Hypertensive urgency   Type 2 diabetes mellitus without complications (HCC)   BPH (benign prostatic hyperplasia)   Hypertensive emergency   Elevated troponin level not due to acute coronary syndrome   Assessment and Plan:  * Acute pulmonary edema (HCC) From H&P'- This is likely secondary to acute new-onset CHF.  I suspect diastolic etiology.  This could be related to hypertensive urgency.  -The patient will be admitted to a cardiac telemetry bed. - We will continue diuresis with IV Lasix .  The patient is already on Jardiance . - Will obtain a 2D echo. - We will follow serial troponins.  Elevated troponin are likely related to his acute CHF and AKI. - We will follow I's and O's and daily weights. - Cardiology consult will be obtained. - I notified [facility] group about the patient.' 12/16: Echo on 12/14: Was read as estimated ejection fraction is 55 to 60%, intermediate diastolic filling due to eating a fusion.  Continue strict I's and O's.  AKI (acute kidney injury) From H&P'- This could be related to #1 due to renal hypoperfusion. - Will continue diuresis with IV Lasix  and follow BMP. - Will avoid nephrotoxins.'  12/16: Improving, not back to baseline.  Will defer management to nephrology.  Continue strict I's and O's Recheck CMP in the a.m.  Hypertensive urgency - We will continue antihypertensive therapy while holding off nephrotoxins. - He will be placed on as  needed IV labetalol  and as needed IV hydralazine . - This is likely contributing to #1.  12/16: Resolved  BPH (benign prostatic hyperplasia) - Continue Proscar .  Type 2 diabetes mellitus without complications (HCC) From H&P'- The patient will be placed on supplemental coverage with NovoLog . - Will continue Jardiance  and Glucotrol  XL.'  12/16: I discontinued Jardiance  in setting of renal, eGFR less than 20 and mild metabolic acidosis.  Continue insulin   SSI with at bedtime coverage.  Avoid long-acting insulin  at this time given renal function.  DVT prophylaxis: Heparin  5000 units twice daily initiated Code Status: Full code Family Communication: Updated daughter over the phone Disposition Plan: Pending clinical course, nephrology recommendation Level of care: Telemetry  Consultants:  Nephrology, cardiology  Procedures:  None at this time  Antimicrobials: None  Subjective:  At bedside, patient is able to tell me his first and last name, age, location, current calendar year.  Objective: Vitals:   03/31/24 0728 03/31/24 0735 03/31/24 1220 03/31/24 1616  BP:  (!) 175/74 (!) 160/58 (!) 154/75  Pulse: 70 71 (!) 58 69  Resp: 16  18 18   Temp: 98.6 F (37 C)  98 F (36.7 C) 98.2 F (36.8 C)  TempSrc:      SpO2: 94%  97% 97%  Weight:      Height:        Intake/Output Summary (Last 24 hours) at 03/31/2024 1713 Last data filed at 03/31/2024 1300 Gross per 24 hour  Intake 1142.12 ml  Output 500 ml  Net 642.12 ml   Filed Weights   03/29/24 0500 03/30/24 0500 03/31/24 0500  Weight: 100.9 kg 99.8 kg 99.1 kg   Examination:  General exam: Appears calm and comfortable  Respiratory system: Clear to auscultation. Respiratory effort normal. Cardiovascular system: S1 & S2 heard, RRR. No JVD, murmurs, rubs, gallops or clicks. No pedal edema. Gastrointestinal system: Abdomen is nondistended, soft and nontender. No organomegaly or masses felt. Normal bowel sounds heard. Central nervous system: Alert and oriented. No focal neurological deficits. Extremities: Symmetric 5 x 5 power. Skin: No rashes, lesions or ulcers Psychiatry: Judgement and insight appear normal. Mood & affect appropriate.   Data Reviewed: I have personally reviewed following labs and imaging studies  CBC: Recent Labs  Lab 03/27/24 1456 03/27/24 2054 03/29/24 0136 03/30/24 1038 03/31/24 0416  WBC 14.7* 16.6* 17.6* 15.7* 14.4*  HGB 12.9* 13.5 12.7* 11.7*  11.4*  HCT 39.3 39.9 36.8* 35.4* 34.4*  MCV 93.6 92.1 90.9 94.1 94.8  PLT 364 357 317 301 270   Basic Metabolic Panel: Recent Labs  Lab 03/27/24 1456 03/27/24 2054 03/28/24 0345 03/29/24 0136 03/30/24 1038 03/31/24 0843  NA 135  --  137 138 137 138  K 5.0  --  4.0 4.0 4.0 3.9  CL 101  --  103 101 100 102  CO2 23  --  21* 23 25 24   GLUCOSE 127*  --  149* 145* 222* 219*  BUN 55*  --  56* 56* 59* 53*  CREATININE 3.24* 3.23* 3.41* 3.77* 4.05* 3.83*  CALCIUM 9.3  --  8.5* 9.0 8.6* 8.9  MG  --   --   --  2.1  --  2.4   GFR: Estimated Creatinine Clearance: 15.4 mL/min (A) (by C-G formula based on SCr of 3.83 mg/dL (H)).  Liver Function Tests: Recent Labs  Lab 03/29/24 0136 03/30/24 1038 03/31/24 0843  AST 12*  --  16  ALT 21  --  16  ALKPHOS 70  --  69  BILITOT 0.5  --  0.5  PROT 6.4*  --  6.4*  ALBUMIN 3.6 3.6 3.7   Coagulation Profile: Recent Labs  Lab 03/28/24 1719  INR 1.0   BNP (last 3 results) Recent Labs    03/27/24 1456  PROBNP 15,059.0*   CBG: Recent Labs  Lab 03/30/24 1650 03/30/24 2050 03/31/24 0734 03/31/24 1130 03/31/24 1616  GLUCAP 214* 176* 173* 312* 148*   Radiology Studies: DG Chest 2 View Result Date: 03/31/2024 CLINICAL DATA:  Dyspnea EXAM: CHEST - 2 VIEW COMPARISON:  Four days ago FINDINGS: Stable cardiomediastinal silhouette. Improved bilateral pulmonary edema compared to prior exam. Small pleural effusions are noted. IMPRESSION: Improved bilateral pulmonary edema. Small pleural effusions. Electronically Signed   By: Lynwood Landy Raddle M.D.   On: 03/31/2024 09:54   Scheduled Meds:  amLODipine   10 mg Oral Daily   aspirin  EC  81 mg Oral Daily   carvedilol   12.5 mg Oral BID WC   heparin  injection (subcutaneous)  5,000 Units Subcutaneous Q12H   hydrALAZINE   100 mg Oral TID   Influenza vac split trivalent PF  0.5 mL Intramuscular Tomorrow-1000   insulin  aspart  0-5 Units Subcutaneous QHS   insulin  aspart  0-9 Units Subcutaneous TID WC    latanoprost   1 drop Both Eyes QHS   multivitamin with minerals  1 tablet Oral Daily   sodium chloride  flush  3 mL Intravenous Q12H   timolol   1 drop Both Eyes BID   Continuous Infusions:  sodium chloride  50 mL/hr at 03/30/24 1738    LOS: 4 days   Time spent: 50 minutes  Dr. Sherre [facility] If 7PM-7AM, please contact night-coverage 03/31/2024, 5:13 PM

## 2024-03-31 NOTE — TOC CM/SW Note (Signed)
 Transition of Care [facility]) - Inpatient Brief Assessment   Patient Details  Name: Timothy Duke MRN: [id number] Date of Birth: [birth year]  Transition of Care [facility]) CM/SW Contact:    Shasta DELENA Daring, RN Phone Number: 03/31/2024, 9:52 AM   Clinical Narrative: Chart reviewed. No TOC needs at this time.   Transition of Care Asessment: Insurance and Status: Insurance coverage has been reviewed Patient has primary care physician: Yes Home environment has been reviewed: single family home Prior level of function:: independent   Social Drivers of Health Review: SDOH reviewed no interventions necessary Readmission risk has been reviewed: Yes Transition of care needs: no transition of care needs at this time

## 2024-03-31 NOTE — Plan of Care (Signed)
   Problem: Education: Goal: Ability to describe self-care measures that may prevent or decrease complications (Diabetes Survival Skills Education) will improve Outcome: Progressing

## 2024-03-31 NOTE — Progress Notes (Signed)
 Heart Failure Nurse Navigator Progress Note  PCP: Cleotilde Oneil FALCON, MD PCP-Cardiologist: New Consult- Dr. Lonni, MD [facility]) Admission Diagnosis: Shortness of breath New Onset of Congestive Heart Failure [facility]) Admitted from: Home  Presentation:   Timothy Duke is a 89 y.o. male who presented with chest pain. He reported that he first had a cough and difficulty breathing last week, prescribed prednisone and antibiotics for bronchitis.  He finished his medication and was feeling better then started having difficulty breathing with chest discomfort.  SOB worsened with exertion and he started to notice some swelling in his legs. BP 201/82, Pulse 75.Past medical history: HTN, DM, CKD & Renal Cancer s/p Nephrectomy Pro-BNP 15,059.  HS-Troponin 53.Chest x-ray : Improved bilateral pulmonary edema. Small pleural effusions.  ECHO/ LVEF: 55-60%  Clinical Course:  Past Medical History:  Diagnosis Date   BPH (benign prostatic hyperplasia)    Chronic kidney disease (CKD), stage IV (severe) (HCC)    Degenerative lumbar spinal stenosis    Diabetes mellitus without complication (HCC)    Glaucoma    Hypertension    PAF (paroxysmal atrial fibrillation) (HCC)    Urothelial cancer (HCC)      Social History   Socioeconomic History   Marital status: Widowed    Spouse name: Not on file   Number of children: Not on file   Years of education: Not on file   Highest education level: Not on file  Occupational History   Not on file  Tobacco Use   Smoking status: Never   Smokeless tobacco: Current    Types: Chew  Vaping Use   Vaping status: Never Used  Substance and Sexual Activity   Alcohol use: No   Drug use: No   Sexual activity: Not on file  Other Topics Concern   Not on file  Social History Narrative   Not on file   Social Drivers of Health   Tobacco Use: High Risk (03/31/2024)   Patient History    Smoking Tobacco Use: Never    Smokeless Tobacco Use: Current    Passive  Exposure: Not on file  Financial Resource Strain: Low Risk  (12/17/2023)   Received from [facility]   Overall Financial Resource Strain (CARDIA)    Difficulty of Paying Living Expenses: Not hard at all  Food Insecurity: No Food Insecurity (03/28/2024)   Epic    Worried About Radiation Protection Practitioner of Food in the Last Year: Never true    Ran Out of Food in the Last Year: Never true  Transportation Needs: No Transportation Needs (03/28/2024)   Epic    Lack of Transportation (Medical): No    Lack of Transportation (Non-Medical): No  Physical Activity: Not on file  Stress: Not on file  Social Connections: Moderately Isolated (03/28/2024)   Social Connection and Isolation Panel    Frequency of Communication with Friends and Family: Three times a week    Frequency of Social Gatherings with Friends and Family: Three times a week    Attends Religious Services: 1 to 4 times per year    Active Member of Clubs or Organizations: No    Attends Banker Meetings: Never    Marital Status: Widowed  Depression (PHQ2-9): Not on file  Alcohol Screen: Not on file  Housing: Low Risk (03/28/2024)   Epic    Unable to Pay for Housing in the Last Year: No    Number of Times Moved in the Last Year: 0    Homeless  in the Last Year: No  Utilities: Not At Risk (03/28/2024)   Epic    Threatened with loss of utilities: No  Health Literacy: Not on file   Education Assessment and Provision:  Detailed education and instructions provided on heart failure disease management including the following:  *Daughter Santana @ the bedside for Heart Failure Education and scheduling HF Clinic TOC  Signs and symptoms of Heart Failure When to call the physician Importance of daily weights Low sodium diet Fluid restriction Medication management Anticipated future follow-up appointments  Patient education given on each of the above topics.  Patient acknowledges understanding via teach back method and  acceptance of all instructions.  Education Materials:  Living Better With Heart Failure Booklet, HF zone tool, & Daily Weight Tracker Tool.  Patient has scale at home: Yes Patient has pill box at home: Yes   High Risk Criteria for Readmission and/or Poor Patient Outcomes: Heart failure hospital admissions (last 6 months): 1  No Show rate: 2% Difficult social situation: Hearing Impairment-usually wears hearing aids Demonstrates medication adherence: Yes Primary Language: English Literacy level: 9th Grade Education.  Has some difficultly reading at times.  Barriers of Care:   Hearing Impairment-usually wears hearing aids  Considerations/Referrals:  Referral made to Heart Failure Pharmacist Stewardship: Yes Referral made to Heart Failure CSW/NCM TOC: No Referral made to Heart & Vascular TOC clinic: Yes.  [facility] 04/23/24 @ 8:30 AM.  Items for Follow-up on DC/TOC: Daily Weights Diet & Fluid Restrictions Continued Heart Failure Education  Charmaine Pines, RN, BSN [facility] Heart Failure Navigator Secure Chat Only

## 2024-03-31 NOTE — Progress Notes (Signed)
 Rounding Note   Patient Name: Timothy Duke Date of Encounter: 03/31/2024  [facility] Cardiologist: None   Subjective Patient reports continued improvements in dyspnea.  Renal ultrasound yesterday negative for hydronephrosis.  Continued on IV fluids per nephrology.  Renal function improved slightly today.  Scheduled Meds:  amLODipine   10 mg Oral Daily   aspirin  EC  81 mg Oral Daily   carvedilol   12.5 mg Oral BID WC   empagliflozin   10 mg Oral Daily   hydrALAZINE   100 mg Oral TID   Influenza vac split trivalent PF  0.5 mL Intramuscular Tomorrow-1000   insulin  aspart  0-5 Units Subcutaneous QHS   insulin  aspart  0-9 Units Subcutaneous TID WC   latanoprost   1 drop Both Eyes QHS   multivitamin with minerals  1 tablet Oral Daily   sodium chloride  flush  3 mL Intravenous Q12H   timolol   1 drop Both Eyes BID   Continuous Infusions:  sodium chloride  50 mL/hr at 03/30/24 1738   PRN Meds: acetaminophen , ALPRAZolam , hydrALAZINE , labetalol , ondansetron  (ZOFRAN ) IV, sodium chloride  flush, traZODone    Vital Signs  Vitals:   03/31/24 0500 03/31/24 0615 03/31/24 0728 03/31/24 0735  BP:  (!) 153/70  (!) 175/74  Pulse:   70 71  Resp:  16 16   Temp:   98.6 F (37 C)   TempSrc:      SpO2:   94%   Weight: 99.1 kg     Height:        Intake/Output Summary (Last 24 hours) at 03/31/2024 0941 Last data filed at 03/31/2024 0600 Gross per 24 hour  Intake 782.12 ml  Output 500 ml  Net 282.12 ml      03/31/2024    5:00 AM 03/30/2024    5:00 AM 03/29/2024    5:00 AM  Last 3 Weights  Weight (lbs) 218 lb 7.6 oz 220 lb 0.3 oz 222 lb 7.1 oz  Weight (kg) 99.1 kg 99.8 kg 100.9 kg      Telemetry Normal sinus rhythm with occasional PACs and PVCs- Personally Reviewed  Physical Exam  GEN: No acute distress.   Neck: No JVD Cardiac: RRR with frequent ectopy, no murmurs, rubs, or gallops.  Respiratory: Clear to auscultation bilaterally GI: Soft, nontender, non-distended   MS: No edema; No deformity. Neuro:  Nonfocal  Psych: Normal affect   Labs High Sensitivity Troponin:  No results for input(s): TROPONINIHS in the last 720 hours.  Recent Labs  Lab 03/27/24 1456 03/27/24 1656 03/27/24 2355 03/28/24 0345  TRNPT 54* 53* 58* 62*       Chemistry Recent Labs  Lab 03/29/24 0136 03/30/24 1038 03/31/24 0843  NA 138 137 138  K 4.0 4.0 3.9  CL 101 100 102  CO2 23 25 24   GLUCOSE 145* 222* 219*  BUN 56* 59* 53*  CREATININE 3.77* 4.05* 3.83*  CALCIUM 9.0 8.6* 8.9  MG 2.1  --  2.4  PROT 6.4*  --  6.4*  ALBUMIN 3.6 3.6 3.7  AST 12*  --  16  ALT 21  --  16  ALKPHOS 70  --  69  BILITOT 0.5  --  0.5  GFRNONAA 15* 13* 14*  ANIONGAP 14 12 12     Lipids No results for input(s): CHOL, TRIG, HDL, LABVLDL, LDLCALC, CHOLHDL in the last 168 hours.  Hematology Recent Labs  Lab 03/29/24 0136 03/30/24 1038 03/31/24 0416  WBC 17.6* 15.7* 14.4*  RBC 4.05* 3.76* 3.63*  HGB 12.7* 11.7*  11.4*  HCT 36.8* 35.4* 34.4*  MCV 90.9 94.1 94.8  MCH 31.4 31.1 31.4  MCHC 34.5 33.1 33.1  RDW 13.2 13.6 13.3  PLT 317 301 270   Thyroid No results for input(s): TSH, FREET4 in the last 168 hours.  BNP Recent Labs  Lab 03/27/24 1456  PROBNP 15,059.0*    DDimer No results for input(s): DDIMER in the last 168 hours.   Radiology  US  RENAL Result Date: 03/29/2024 IMPRESSION: 1. There is mild left-sided pelviectasis, favored similar compared to priors. 2. There is a 11 mm nonobstructing nephrolithiasis or cortical calcification in the left kidney. 3. Increased echogenicity of the left kidney as can be seen in medical renal disease. 4. Circumferential bladder wall prominence may be secondary to underdistention. Recommend correlation with urinalysis. Electronically Signed   By: Corean Salter M.D.   On: 03/29/2024 18:48   Cardiac Studies  03/29/2024 Echo complete 1. Left ventricular ejection fraction, by estimation, is 55 to 60%. The  left  ventricle has normal function. The left ventricle has no regional  wall motion abnormalities. There is mild concentric left ventricular  hypertrophy. Indeterminate diastolic filling due to E-A fusion.   2. Right ventricular systolic function is normal. The right ventricular  size is normal.   3. Left atrial size was mildly dilated.   4. The mitral valve is normal in structure. Trivial mitral valve  regurgitation. No evidence of mitral stenosis.   5. The aortic valve was not well visualized. There is moderate  calcification of the aortic valve. Aortic valve regurgitation is trivial.  Aortic valve sclerosis/calcification is present, without any evidence of  aortic stenosis.   Patient Profile   89 y.o. 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  Assessment & Plan   Acute on chronic diastolic heart failure - Unclear chronicity - Likely multifactorial with hypertensive emergency and advanced renal disease - Echo revealed EF 55 to 60%, no RWMA, mild concentric LVH, indeterminate diastolic filling due to E-A fusion, normal RV function, and no significant valvular abnormalities - Symptoms improved with IV Lasix  but worsened renal function.  Subsequently started on IV fluids per nephrology. - Repeat chest x-ray to reassess pulmonary edema and pleural effusions - Nephrology concerned for possible cardiorenal syndrome with worsening kidney function yesterday.  However, patient remains on IV fluids with improvement in renal function today. - GDMT limited by renal dysfunction - Continue to hold IV Lasix  - Nephrology following  Elevated troponin Hypertensive emergency - Troponin mildly elevated and flat trending, not consistent with ACS - BP 201/82 on admission - Echo with preserved LV  systolic function and no RWMA - No plan for ischemic evaluation, especially given renal dysfunction - On clonidine  PTA, would commend avoiding this moving forward given poor renal clearance and risk for rebound hypertension as well as advanced age - BP remains elevated, although overall improving - Continue amlodipine  10 mg daily, carvedilol  12.5 mg twice daily, and hydralazine  100 mg 3 times daily  Paroxysmal atrial fibrillation - Has historically not been anticoagulated long-term.  Unclear frequency of events, only documented postoperatively in 2023. - CHA2DS2-VASc at least 5 - No atrial fibrillation noted this admission - Would recommend anticoagulation long-term if A-fib were to recur  CKD stage IV - Kidney function improving with IV fluids - Will need to  monitor volume status closely - Nephrology following  PVCs - Continue telemetry monitoring - Echo with preserved LVEF - Continue carvedilol  as above  For questions or updates, please contact [facility] Please consult [email or web address] for contact info under       Signed, Lesley LITTIE Maffucci, PA-C  03/31/2024, 9:41 AM

## 2024-03-31 NOTE — Progress Notes (Signed)
 [facility]  ROUNDING NOTE   Subjective:   Renal function slightly better today with creatinine down to 3.83. Patient wondering when he can go home.  Lab Results  Component Value Date   CREATININE 3.83 (H) 03/31/2024   CREATININE 4.05 (H) 03/30/2024   CREATININE 3.77 (H) 03/29/2024     Objective:  Vital signs in last 24 hours:  Temp:  [98 F (36.7 C)-98.6 F (37 C)] 98.2 F (36.8 C) (12/16 1616) Pulse Rate:  [58-74] 69 (12/16 1616) Resp:  [16-20] 18 (12/16 1616) BP: (153-185)/(58-86) 154/75 (12/16 1616) SpO2:  [94 %-97 %] 97 % (12/16 1616) Weight:  [99.1 kg] 99.1 kg (12/16 0500)  Weight change: -0.7 kg Filed Weights   03/29/24 0500 03/30/24 0500 03/31/24 0500  Weight: 100.9 kg 99.8 kg 99.1 kg    Intake/Output: I/O last 3 completed shifts: In: 1923.2 [P.O.:1020; I.V.:903.2] Out: 1275 [Urine:1275]   Intake/Output this shift:  Total I/O In: 600 [P.O.:600] Out: -   Physical Exam: General: No acute distress  Head: Normocephalic, atraumatic. Moist oral mucosal membranes  Neck: Supple  Lungs:  Clear to auscultation, normal effort  Heart: S1S2 no rubs  Abdomen:  Soft, nontender, bowel sounds present  Extremities: Trace peripheral edema.  Neurologic: Awake, alert, following commands  Skin: No acute rash  Access: No hemodialysis access    Basic Metabolic Panel: Recent Labs  Lab 03/27/24 1456 03/27/24 2054 03/28/24 0345 03/29/24 0136 03/30/24 1038 03/31/24 0843  NA 135  --  137 138 137 138  K 5.0  --  4.0 4.0 4.0 3.9  CL 101  --  103 101 100 102  CO2 23  --  21* 23 25 24   GLUCOSE 127*  --  149* 145* 222* 219*  BUN 55*  --  56* 56* 59* 53*  CREATININE 3.24* 3.23* 3.41* 3.77* 4.05* 3.83*  CALCIUM 9.3  --  8.5* 9.0 8.6* 8.9  MG  --   --   --  2.1  --  2.4    Liver Function Tests: Recent Labs  Lab 03/29/24 0136 03/30/24 1038 03/31/24 0843  AST 12*  --  16  ALT 21  --  16  ALKPHOS 70  --  69  BILITOT 0.5  --  0.5  PROT 6.4*  --   6.4*  ALBUMIN 3.6 3.6 3.7   No results for input(s): LIPASE, AMYLASE in the last 168 hours. No results for input(s): AMMONIA in the last 168 hours.  CBC: Recent Labs  Lab 03/27/24 1456 03/27/24 2054 03/29/24 0136 03/30/24 1038 03/31/24 0416  WBC 14.7* 16.6* 17.6* 15.7* 14.4*  HGB 12.9* 13.5 12.7* 11.7* 11.4*  HCT 39.3 39.9 36.8* 35.4* 34.4*  MCV 93.6 92.1 90.9 94.1 94.8  PLT 364 357 317 301 270    Cardiac Enzymes: No results for input(s): CKTOTAL, CKMB, CKMBINDEX, TROPONINI in the last 168 hours.  BNP: Invalid input(s): POCBNP  CBG: Recent Labs  Lab 03/30/24 1650 03/30/24 2050 03/31/24 0734 03/31/24 1130 03/31/24 1616  GLUCAP 214* 176* 173* 312* 148*    Microbiology: Results for orders placed or performed during the hospital encounter of 03/14/21  Urine Culture     Status: None   Collection Time: 03/14/21 12:39 PM   Specimen: PATH Cytology Urine  Result Value Ref Range Status   Specimen Description   Final    URINE, RANDOM Performed at [facility], [address] [place], [place] [address]    Special Requests   Final  NONE Performed at [facility], [address], [place], [place] [address]    Culture   Final    NO GROWTH Performed at [facility], [address] [place], [place] [address]    Report Status 03/15/2021 FINAL  Final    Coagulation Studies: Recent Labs    03/28/24 1719  LABPROT 14.2  INR 1.0    Urinalysis: Recent Labs    03/30/24 1845  COLORURINE YELLOW*  LABSPEC 1.014  PHURINE 6.0  GLUCOSEU >=500*  HGBUR NEGATIVE  BILIRUBINUR NEGATIVE  KETONESUR NEGATIVE  PROTEINUR >=300*  NITRITE NEGATIVE  LEUKOCYTESUR NEGATIVE      Imaging: DG Chest 2 View Result Date: 03/31/2024 CLINICAL DATA:  Dyspnea EXAM: CHEST - 2 VIEW COMPARISON:  Four days ago FINDINGS: Stable cardiomediastinal silhouette. Improved bilateral pulmonary edema compared to prior exam. Small pleural effusions are  noted. IMPRESSION: Improved bilateral pulmonary edema. Small pleural effusions. Electronically Signed   By: Lynwood Landy Raddle M.D.   On: 03/31/2024 09:54     Medications:      amLODipine   10 mg Oral Daily   aspirin  EC  81 mg Oral Daily   carvedilol   12.5 mg Oral BID WC   heparin  injection (subcutaneous)  5,000 Units Subcutaneous Q12H   hydrALAZINE   100 mg Oral TID   Influenza vac split trivalent PF  0.5 mL Intramuscular Tomorrow-1000   insulin  aspart  0-5 Units Subcutaneous QHS   insulin  aspart  0-9 Units Subcutaneous TID WC   latanoprost   1 drop Both Eyes QHS   multivitamin with minerals  1 tablet Oral Daily   sodium chloride  flush  3 mL Intravenous Q12H   timolol   1 drop Both Eyes BID   acetaminophen , ALPRAZolam , hydrALAZINE , labetalol , ondansetron  (ZOFRAN ) IV, sodium chloride  flush, traZODone   Assessment/ Plan:  89 y.o. male with a PMHx of diabetes mellitus type 2, hypertension, glaucoma, lumbar spinal stenosis, chronic kidney disease stage IV with history of right-sided nephrectomy, anemia of chronic kidney disease, BPH who was admitted to [facility] on 03/27/2024 for evaluation of worsening lower extremity edema and dyspnea with exertion.    1.  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  Baseline eGFR 17. Suspect renal function altered now due to cardiorenal syndrome.  Renal ultrasound negative for hydronephrosis. Lab Results  Component Value Date   CREATININE 3.83 (H) 03/31/2024   CREATININE 4.05 (H) 03/30/2024   CREATININE 3.77 (H) 03/29/2024   Update: Renal function improved today with creatinine down to 3.83 with eGFR up slightly to 14.  No immediate need for renal placement therapy but patient high risk for progression to ESRD.  2.  Hypertension.  Blood pressure 154/75 earlier.  Maintain the patient on amlodipine , carvedilol , and hydralazine .  Blood pressure labile.  3.  Anemia  chronic kidney disease.  Hemoglobin 11.4.  No immediate need for Epogen.   LOS: 4 Timothy Duke 12/16/20255:15 PM

## 2024-03-31 NOTE — Plan of Care (Signed)

## 2024-04-01 ENCOUNTER — Other Ambulatory Visit: Payer: Self-pay

## 2024-04-01 DIAGNOSIS — I132 Hypertensive heart and chronic kidney disease with heart failure and with stage 5 chronic kidney disease, or end stage renal disease: Secondary | ICD-10-CM | POA: Diagnosis not present

## 2024-04-01 DIAGNOSIS — I160 Hypertensive urgency: Secondary | ICD-10-CM | POA: Diagnosis not present

## 2024-04-01 DIAGNOSIS — J810 Acute pulmonary edema: Secondary | ICD-10-CM | POA: Diagnosis not present

## 2024-04-01 DIAGNOSIS — I25118 Atherosclerotic heart disease of native coronary artery with other forms of angina pectoris: Secondary | ICD-10-CM | POA: Diagnosis not present

## 2024-04-01 DIAGNOSIS — R0602 Shortness of breath: Secondary | ICD-10-CM | POA: Diagnosis not present

## 2024-04-01 DIAGNOSIS — E119 Type 2 diabetes mellitus without complications: Secondary | ICD-10-CM | POA: Diagnosis not present

## 2024-04-01 DIAGNOSIS — I509 Heart failure, unspecified: Secondary | ICD-10-CM | POA: Diagnosis not present

## 2024-04-01 DIAGNOSIS — R7989 Other specified abnormal findings of blood chemistry: Secondary | ICD-10-CM | POA: Diagnosis not present

## 2024-04-01 LAB — COMPREHENSIVE METABOLIC PANEL WITH GFR
ALT: 17 U/L (ref 0–44)
AST: 15 U/L (ref 15–41)
Albumin: 3.3 g/dL — ABNORMAL LOW (ref 3.5–5.0)
Alkaline Phosphatase: 64 U/L (ref 38–126)
Anion gap: 15 (ref 5–15)
BUN: 52 mg/dL — ABNORMAL HIGH (ref 8–23)
CO2: 22 mmol/L (ref 22–32)
Calcium: 8.3 mg/dL — ABNORMAL LOW (ref 8.9–10.3)
Chloride: 102 mmol/L (ref 98–111)
Creatinine, Ser: 3.73 mg/dL — ABNORMAL HIGH (ref 0.61–1.24)
GFR, Estimated: 15 mL/min — ABNORMAL LOW (ref >60)
Glucose, Bld: 136 mg/dL — ABNORMAL HIGH (ref 70–99)
Potassium: 3.7 mmol/L (ref 3.5–5.1)
Sodium: 138 mmol/L (ref 135–145)
Total Bilirubin: 0.4 mg/dL (ref 0.0–1.2)
Total Protein: 5.9 g/dL — ABNORMAL LOW (ref 6.5–8.1)

## 2024-04-01 LAB — GLUCOSE, CAPILLARY
Glucose-Capillary: 161 mg/dL — ABNORMAL HIGH (ref 70–99)
Glucose-Capillary: 195 mg/dL — ABNORMAL HIGH (ref 70–99)

## 2024-04-01 MED ORDER — ISOSORBIDE MONONITRATE ER 30 MG PO TB24
30.0000 mg | ORAL_TABLET | Freq: Two times a day (BID) | ORAL | Status: DC
  Administered 2024-04-01: 10:00:00 30 mg via ORAL
  Filled 2024-04-01: qty 1

## 2024-04-01 MED ORDER — CARVEDILOL 12.5 MG PO TABS
12.5000 mg | ORAL_TABLET | Freq: Two times a day (BID) | ORAL | 2 refills | Status: DC
  Filled 2024-04-01: qty 60, 30d supply, fill #0

## 2024-04-01 MED ORDER — ISOSORBIDE MONONITRATE ER 30 MG PO TB24
30.0000 mg | ORAL_TABLET | Freq: Two times a day (BID) | ORAL | 2 refills | Status: AC
  Filled 2024-04-01: qty 60, 30d supply, fill #0

## 2024-04-01 MED ORDER — HYDRALAZINE HCL 50 MG PO TABS
100.0000 mg | ORAL_TABLET | Freq: Two times a day (BID) | ORAL | 2 refills | Status: AC
  Filled 2024-04-01: qty 60, 15d supply, fill #0

## 2024-04-01 MED ORDER — TRAZODONE HCL 50 MG PO TABS
25.0000 mg | ORAL_TABLET | Freq: Every evening | ORAL | 0 refills | Status: AC | PRN
  Filled 2024-04-01: qty 30, 60d supply, fill #0

## 2024-04-01 NOTE — Discharge Summary (Signed)
 Physician Discharge Summary   Patient: Timothy Duke MRN: [id number] DOB: [birth year]  Admit date:     03/27/2024  Discharge date: 04/01/2024  Discharge Physician: Drue ONEIDA Potter   PCP: Cleotilde Oneil FALCON, MD   Recommendations at discharge:  Continue to follow-up cardiology, PCP  Discharge Diagnoses: Acute pulmonary edema (HCC) AKI (acute kidney injury) Hypertensive urgency BPH (benign prostatic hyperplasia) Type 2 diabetes mellitus without complications [facility])  Hospital Course: From HPI Timothy Duke is a 89 y.o. male w/PMH of DM, HTN, glaucoma, lumbar spine stenosis, history of A-fib not on anticoagulation  who presented to the ER with acute onset of worsening dyspnea with associated orthopnea and paroxysmal nocturnal dyspnea since last night.  The patient admitted to worsening lower extremity edema and dyspnea on exertion.  He has been having dry cough and wheezing.  He denied any chest pain or palpitations.  He was diagnosed with acute bronchitis on 12/1.  He denied any nausea or vomiting or abdominal pain.  No dysuria, oliguria or hematuria or flank pain.   ED Course: When he came to the ER, BP was 121/82 with otherwise normal vital signs.  Labs revealed a BUN of 55 with creatinine 3.24 and proBNP 15,059, high-sensitivity troponin was 54 and later 53.  CBC showed leukocytosis 14.7 with hemoglobin 12.9 hematocrit 39.3. EKG as reviewed by me : EKG showed sinus rhythm with a rate of 69 with occasional premature ventricular complexes, left axis deviation and septal Q waves. Imaging: 2 view chest x-ray showed bilateral pulmonary edema with small pleural effusions. Noncontrast head CT scan showed no acute intracranial normalities.   Patient was admitted for acute pulmonary edema underwent IV diuresis.  Was seen by cardiology as well as nephrologist and has now been cleared for discharge today    Consultants: Cardiology, nephrology Procedures performed: None Disposition: Home Diet  recommendation:  Cardiac diet DISCHARGE MEDICATION: Allergies as of 04/01/2024   No Known Allergies      Medication List     STOP taking these medications    cloNIDine  0.1 MG tablet Commonly known as: CATAPRES    finasteride  5 MG tablet Commonly known as: PROSCAR    furosemide  20 MG tablet Commonly known as: LASIX    Jardiance  10 MG Tabs tablet Generic drug: empagliflozin    lisinopril -hydrochlorothiazide  20-12.5 MG tablet Commonly known as: ZESTORETIC    Metoprolol  Succinate 50 MG Cs24   mirabegron  ER 25 MG Tb24 tablet Commonly known as: MYRBETRIQ    olmesartan-hydrochlorothiazide  40-12.5 MG tablet Commonly known as: BENICAR HCT   Tradjenta 5 MG Tabs tablet Generic drug: linagliptin       TAKE these medications    Acetaminophen  500 MG capsule Take 500 mg by mouth daily.   amLODipine  10 MG tablet Commonly known as: NORVASC  Take by mouth.   aspirin  EC 81 MG tablet Take 81 mg by mouth daily.   carvedilol  12.5 MG tablet Commonly known as: COREG  Take 1 tablet (12.5 mg total) by mouth 2 (two) times daily with a meal.   glipiZIDE  10 MG 24 hr tablet Commonly known as: GLUCOTROL  XL Take 10 mg by mouth daily with breakfast.   hydrALAZINE  50 MG tablet Commonly known as: APRESOLINE  Take 2 tablets (100 mg total) by mouth 2 (two) times daily. What changed: how much to take   isosorbide  mononitrate 30 MG 24 hr tablet Commonly known as: IMDUR  Take 1 tablet (30 mg total) by mouth 2 (two) times daily.   latanoprost  0.005 % ophthalmic solution Commonly known as: XALATAN  Place 1  drop into both eyes at bedtime.   Multi-Vitamins Tabs Take 1 tablet by mouth daily.   Ozempic (1 MG/DOSE) 4 MG/3ML Sopn Generic drug: Semaglutide (1 MG/DOSE) Inject 1 mg into the skin once a week.   glucose blood test strip USE AS DIRECTED TWICE DAILY   Prodigy No Coding Blood Gluc test strip Generic drug: glucose blood 2 (two) times daily. as directed   Prodigy Twist Top  Lancets 28G Misc USE TO CHECK BLOOD SUGAR TWICE DAILY AS INSTRUCTED   timolol  0.5 % ophthalmic solution Commonly known as: TIMOPTIC  Place 1 drop into both eyes 2 (two) times daily.   traZODone  50 MG tablet Commonly known as: DESYREL  Take 0.5 tablets (25 mg total) by mouth at bedtime as needed for sleep.               Durable Medical Equipment  (From admission, onward)           Start     Ordered   04/01/24 1338  For home use only DME Walker rolling  Once       Question Answer Comment  Walker: With 5 Inch Wheels   Patient needs a walker to treat with the following condition Ambulatory dysfunction      04/01/24 1338            Follow-up Information     [facility]. Go on 04/23/2024.   Specialty: Cardiology Why: Hospital Follow-Up 04/23/2024 @ 8:30 AM Please bring all medications to follow-up appointment [facility], [address] Second Floor Free Valet Parking at the door Contact information: [address] [place]  [address] [phone]               Discharge Exam: Fredricka Weights   03/30/24 0500 03/31/24 0500 04/01/24 0517  Weight: 99.8 kg 99.1 kg 99.3 kg    General exam: Appears calm and comfortable  Respiratory system: Clear to auscultation. Respiratory effort normal. Cardiovascular system: S1 & S2 heard, RRR. No JVD, murmurs, rubs, gallops or clicks. No pedal edema. Gastrointestinal system: Abdomen is nondistended, soft and nontender. No organomegaly or masses felt. Normal bowel sounds heard. Central nervous system: Alert and oriented. No focal neurological deficits. Extremities: Symmetric 5 x 5 power. Skin: No rashes, lesions or ulcers Psychiatry: Judgement and insight appear normal. Mood & affect appropriate.   Condition at discharge: good  The results of significant diagnostics from this hospitalization (including imaging, microbiology, ancillary and  laboratory) are listed below for reference.   Imaging Studies: DG Chest 2 View Result Date: 03/31/2024 CLINICAL DATA:  Dyspnea EXAM: CHEST - 2 VIEW COMPARISON:  Four days ago FINDINGS: Stable cardiomediastinal silhouette. Improved bilateral pulmonary edema compared to prior exam. Small pleural effusions are noted. IMPRESSION: Improved bilateral pulmonary edema. Small pleural effusions. Electronically Signed   By: Lynwood Landy Raddle M.D.   On: 03/31/2024 09:54   US  RENAL Result Date: 03/29/2024 CLINICAL DATA:  201281 Chronic renal disease, stage IV (HCC) 201281 EXAM: RENAL / URINARY TRACT ULTRASOUND COMPLETE COMPARISON:  July 07, 2019 FINDINGS: Right Kidney: Surgically absent Left Kidney: Renal measurements: 13.9 x 6.0 x 6.1 cm = volume: 266 mL. Echogenicity is increased. There are multiple anechoic masses noted throughout the kidney with posterior acoustic enhancement consistent with simple cysts. Largest measures approximately 4.5 cm (for which no dedicated imaging follow-up is recommended). Echogenic focus with twinkle artifact and posterior acoustic shadowing measuring 11 mm most consistent with a nonobstructing nephrolithiasis or  cortical calcification. There is mild pelviectasis, favored similar compared to priors MRI. Bladder: Bladder is decompressed with circumferential wall prominence measuring approximately 5 mm. Other: None. IMPRESSION: 1. There is mild left-sided pelviectasis, favored similar compared to priors. 2. There is a 11 mm nonobstructing nephrolithiasis or cortical calcification in the left kidney. 3. Increased echogenicity of the left kidney as can be seen in medical renal disease. 4. Circumferential bladder wall prominence may be secondary to underdistention. Recommend correlation with urinalysis. Electronically Signed   By: Corean Salter M.D.   On: 03/29/2024 18:48   ECHOCARDIOGRAM COMPLETE Result Date: 03/29/2024    ECHOCARDIOGRAM REPORT   Patient Name:   Aharon C Batch Date  of Exam: 03/29/2024 Medical Rec #:  [id number]      Height:       70.0 in Accession #:    [id number]     Weight:       222.4 lb Date of Birth:  February 15, 1933       BSA:          2.184 m Patient Age:    89 years       BP:           162/73 mmHg Patient Gender: M              HR:           80 bpm. Exam Location:  [facility] Procedure: 2D Echo, Cardiac Doppler and Color Doppler (Both Spectral and Color            Flow Doppler were utilized during procedure). Indications:     CHF-Acute Diastolic I50.31  History:         Patient has no prior history of Echocardiogram examinations.                  Risk Factors:Hypertension and Diabetes.  Sonographer:     Bari Roar Referring Phys:  [id number] [facility] Diagnosing Phys: Shelda Bruckner MD IMPRESSIONS  1. Left ventricular ejection fraction, by estimation, is 55 to 60%. The left ventricle has normal function. The left ventricle has no regional wall motion abnormalities. There is mild concentric left ventricular hypertrophy. Indeterminate diastolic filling due to E-A fusion.  2. Right ventricular systolic function is normal. The right ventricular size is normal.  3. Left atrial size was mildly dilated.  4. The mitral valve is normal in structure. Trivial mitral valve regurgitation. No evidence of mitral stenosis.  5. The aortic valve was not well visualized. There is moderate calcification of the aortic valve. Aortic valve regurgitation is trivial. Aortic valve sclerosis/calcification is present, without any evidence of aortic stenosis. Comparison(s): No prior Echocardiogram. Conclusion(s)/Recommendation(s): Otherwise normal echocardiogram, with minor abnormalities described in the report. FINDINGS  Left Ventricle: Left ventricular ejection fraction, by estimation, is 55 to 60%. The left ventricle has normal function. The left ventricle has no regional wall motion abnormalities. The left ventricular internal cavity size was normal in size. There is  mild concentric left  ventricular hypertrophy. Abnormal (paradoxical) septal motion, consistent with left bundle branch block. Indeterminate diastolic filling due to E-A fusion. Right Ventricle: The right ventricular size is normal. No increase in right ventricular wall thickness. Right ventricular systolic function is normal. Left Atrium: Left atrial size was mildly dilated. Right Atrium: Right atrial size was normal in size. Pericardium: There is no evidence of pericardial effusion. Mitral Valve: The mitral valve is normal in structure. Trivial mitral valve regurgitation. No evidence of mitral valve stenosis. MV peak  gradient, 6.2 mmHg. The mean mitral valve gradient is 2.0 mmHg. Tricuspid Valve: The tricuspid valve is normal in structure. Tricuspid valve regurgitation is trivial. No evidence of tricuspid stenosis. Aortic Valve: The aortic valve was not well visualized. There is moderate calcification of the aortic valve. Aortic valve regurgitation is trivial. Aortic regurgitation PHT measures 688 msec. Aortic valve sclerosis/calcification is present, without any evidence of aortic stenosis. Aortic valve mean gradient measures 3.0 mmHg. Aortic valve peak gradient measures 6.2 mmHg. Aortic valve area, by VTI measures 3.04 cm. Pulmonic Valve: The pulmonic valve was not well visualized. Pulmonic valve regurgitation is not visualized. No evidence of pulmonic stenosis. Aorta: The aortic root, ascending aorta and aortic arch are all structurally normal, with no evidence of dilitation or obstruction. Venous: The inferior vena cava was not well visualized. IAS/Shunts: There is right bowing of the interatrial septum, suggestive of elevated left atrial pressure. The atrial septum is grossly normal.  LEFT VENTRICLE PLAX 2D LVIDd:         5.10 cm      Diastology LVIDs:         3.70 cm      LV e' medial:    5.22 cm/s LV PW:         1.30 cm      LV E/e' medial:  11.4 LV IVS:        1.50 cm      LV e' lateral:   7.83 cm/s LVOT diam:     2.10 cm       LV E/e' lateral: 7.6 LV SV:         70 LV SV Index:   32 LVOT Area:     3.46 cm  LV Volumes (MOD) LV vol d, MOD A2C: 110.0 ml LV vol d, MOD A4C: 109.0 ml LV vol s, MOD A2C: 53.7 ml LV vol s, MOD A4C: 48.7 ml LV SV MOD A2C:     56.3 ml LV SV MOD A4C:     109.0 ml LV SV MOD BP:      61.2 ml RIGHT VENTRICLE RV Basal diam:  3.70 cm RV Mid diam:    3.10 cm RV S prime:     14.90 cm/s TAPSE (M-mode): 3.1 cm LEFT ATRIUM             Index        RIGHT ATRIUM           Index LA diam:        4.00 cm 1.83 cm/m   RA Area:     15.50 cm LA Vol (A2C):   71.7 ml 32.83 ml/m  RA Volume:   37.20 ml  17.03 ml/m LA Vol (A4C):   68.4 ml 31.32 ml/m LA Biplane Vol: 70.3 ml 32.19 ml/m  AORTIC VALVE                    PULMONIC VALVE AV Area (Vmax):    3.33 cm     PV Vmax:        1.43 m/s AV Area (Vmean):   2.91 cm     PV Peak grad:   8.2 mmHg AV Area (VTI):     3.04 cm     RVOT Peak grad: 5 mmHg AV Vmax:           125.00 cm/s AV Vmean:          87.600 cm/s AV VTI:  0.230 m AV Peak Grad:      6.2 mmHg AV Mean Grad:      3.0 mmHg LVOT Vmax:         120.00 cm/s LVOT Vmean:        73.500 cm/s LVOT VTI:          0.202 m LVOT/AV VTI ratio: 0.88 AI PHT:            688 msec  AORTA Ao Root diam: 3.00 cm Ao Asc diam:  3.40 cm MITRAL VALVE                TRICUSPID VALVE MV Area (PHT): 5.27 cm     TR Peak grad:   24.8 mmHg MV Area VTI:   2.29 cm     TR Vmax:        249.00 cm/s MV Peak grad:  6.2 mmHg MV Mean grad:  2.0 mmHg     SHUNTS MV Vmax:       1.24 m/s     Systemic VTI:  0.20 m MV Vmean:      71.5 cm/s    Systemic Diam: 2.10 cm MV Decel Time: 144 msec MV E velocity: 59.40 cm/s MV A velocity: 135.00 cm/s MV E/A ratio:  0.44 MV A Prime:    13.7 cm/s Shelda Bruckner MD Electronically signed by Shelda Bruckner MD Signature Date/Time: 03/29/2024/10:52:45 AM    Final    DG Chest 2 View Result Date: 03/27/2024 CLINICAL DATA:  Chest pain EXAM: CHEST - 2 VIEW COMPARISON:  May 01, 2014 FINDINGS: Mild cardiomegaly is  noted. Bilateral pulmonary edema is noted with small pleural effusions. Bony thorax is unremarkable. IMPRESSION: Bilateral pulmonary edema is noted with small pleural effusions. Electronically Signed   By: Lynwood Landy Raddle M.D.   On: 03/27/2024 15:48   CT Head Wo Contrast Result Date: 03/27/2024 EXAM: CT HEAD WITHOUT 03/27/2024 03:23:11 PM TECHNIQUE: CT of the head was performed without the administration of intravenous contrast. Automated exposure control, iterative reconstruction, and/or weight based adjustment of the mA/kV was utilized to reduce the radiation dose to as low as reasonably achievable. COMPARISON: 05/01/2014. CLINICAL HISTORY: HA / htn FINDINGS: BRAIN AND VENTRICLES: No acute intracranial hemorrhage. No mass effect or midline shift. No extra-axial fluid collection. No evidence of acute infarct. No hydrocephalus. Tiny 5 mm fatty lesion along left aspect of falx, unchanged since 2016. Mild chronic microvascular ischemic disease. Calcific atherosclerosis. ORBITS: No acute abnormality. SINUSES AND MASTOIDS: No acute abnormality. SOFT TISSUES AND SKULL: No acute skull fracture. No acute soft tissue abnormality. IMPRESSION: 1. No acute intracranial abnormality. Electronically signed by: Donnice Mania MD 03/27/2024 03:32 PM EST RP Workstation: [id number]    Microbiology: Results for orders placed or performed during the hospital encounter of 03/14/21  Urine Culture     Status: None   Collection Time: 03/14/21 12:39 PM   Specimen: PATH Cytology Urine  Result Value Ref Range Status   Specimen Description   Final    URINE, RANDOM Performed at [facility], [address], [place], [place] [address]    Special Requests   Final    NONE Performed at [facility], [address], [place], [place] [address]    Culture   Final    NO GROWTH Performed at [facility], [address], [place], [place] [address]    Report Status 03/15/2021 FINAL  Final     Labs: CBC: Recent Labs  Lab 03/27/24 1456 03/27/24  2054 03/29/24 0136 03/30/24 1038 03/31/24 0416  WBC 14.7* 16.6* 17.6* 15.7* 14.4*  HGB 12.9* 13.5 12.7* 11.7* 11.4*  HCT 39.3 39.9 36.8* 35.4* 34.4*  MCV 93.6 92.1 90.9 94.1 94.8  PLT 364 357 317 301 270   Basic Metabolic Panel: Recent Labs  Lab 03/28/24 0345 03/29/24 0136 03/30/24 1038 03/31/24 0843 04/01/24 0450  NA 137 138 137 138 138  K 4.0 4.0 4.0 3.9 3.7  CL 103 101 100 102 102  CO2 21* 23 25 24 22   GLUCOSE 149* 145* 222* 219* 136*  BUN 56* 56* 59* 53* 52*  CREATININE 3.41* 3.77* 4.05* 3.83* 3.73*  CALCIUM 8.5* 9.0 8.6* 8.9 8.3*  MG  --  2.1  --  2.4  --    Liver Function Tests: Recent Labs  Lab 03/29/24 0136 03/30/24 1038 03/31/24 0843 04/01/24 0450  AST 12*  --  16 15  ALT 21  --  16 17  ALKPHOS 70  --  69 64  BILITOT 0.5  --  0.5 0.4  PROT 6.4*  --  6.4* 5.9*  ALBUMIN 3.6 3.6 3.7 3.3*   CBG: Recent Labs  Lab 03/31/24 1616 03/31/24 2015 03/31/24 2033 04/01/24 0730 04/01/24 1148  GLUCAP 148* 187* 170* 161* 195*    Discharge time spent:  39 minutes.  Signed: Drue ONEIDA Potter, MD [facility] 04/01/2024

## 2024-04-01 NOTE — Progress Notes (Signed)
 Discharge instructions given to patient, questions answered. IV removed without complication. Medications were delivered to bedside. Patient transporting home via car by his daughter. Patient belongings with patient to discharge.

## 2024-04-01 NOTE — TOC CM/SW Note (Signed)
 Patient has mobility impairment that limits daily activities. A rolling walker will help with this and they can safely use it.

## 2024-04-01 NOTE — TOC Transition Note (Signed)
 Transition of Care [facility]) - Discharge Note   Patient Details  Name: Timothy Duke MRN: [id number] Date of Birth: 10/23/1932  Transition of Care [facility]) CM/SW Contact:  Lauraine JAYSON Carpen, LCSW Phone Number: 04/01/2024, 2:48 PM   Clinical Narrative:   Patient has orders to discharge home today. MD entered DME order and patient/family prefer that it be shipped to the home. CSW left message for [employer] liaison to notify. No further concerns. CSW signing off.  Final next level of care: Home/Self Care Barriers to Discharge: Barriers Resolved   Patient Goals and CMS Choice            Discharge Placement                Patient to be transferred to facility by: Son   Patient and family notified of of transfer: 04/01/24  Discharge Plan and Services Additional resources added to the After Visit Summary for       Post Acute Care Choice: NA          DME Arranged: Vannie rolling DME Agency: [employer] Date DME Agency Contacted: 04/01/24   Representative spoke with at DME Agency: Thomasina            Social Drivers of Health (SDOH) Interventions SDOH Screenings   Food Insecurity: No Food Insecurity (03/28/2024)  Housing: Low Risk (03/28/2024)  Transportation Needs: No Transportation Needs (03/28/2024)  Utilities: Not At Risk (03/28/2024)  Financial Resource Strain: Low Risk  (12/17/2023)   Received from [facility]  Social Connections: Moderately Isolated (03/28/2024)  Tobacco Use: High Risk (03/31/2024)     Readmission Risk Interventions    04/01/2024   11:00 AM  Readmission Risk Prevention Plan  Transportation Screening Complete  PCP or Specialist Appt within 3-5 Days Complete  Social Work Consult for Recovery Care Planning/Counseling Complete  Palliative Care Screening Not Applicable  Medication Review Oceanographer) Complete

## 2024-04-01 NOTE — TOC Initial Note (Signed)
 Transition of Care [facility]) - Initial/Assessment Note    Patient Details  Name: Timothy Duke MRN: [id number] Date of Birth: [birth year]  Transition of Care [facility]) CM/SW Contact:    Lauraine JAYSON Carpen, LCSW Phone Number: 04/01/2024, 11:02 AM  Clinical Narrative:  Readmission prevention screen complete. CSW met with patient. No family at bedside. CSW introduced role and explained that discharge planning would be discussed. PCP is Oneil Pinal, MD. Daughter drives him to appointments. Pharmacy is [facility] in [place]. No issues affording medications. Patient lives home alone. No home health prior to admission. Patient uses a walking stick prn. No further concerns. CSW will continue to follow patient for support and facilitate return home once stable. Daughter will likely transport patient home at discharge.              Expected Discharge Plan: Home/Self Care Barriers to Discharge: Continued Medical Work up   Patient Goals and CMS Choice            Expected Discharge Plan and Services     Post Acute Care Choice: NA Living arrangements for the past 2 months: Single Family Home                                      Prior Living Arrangements/Services Living arrangements for the past 2 months: Single Family Home Lives with:: Self Patient language and need for interpreter reviewed:: Yes Do you feel safe going back to the place where you live?: Yes      Need for Family Participation in Patient Care: Yes (Comment)     Criminal Activity/Legal Involvement Pertinent to Current Situation/Hospitalization: No - Comment as needed  Activities of Daily Living   ADL Screening (condition at time of admission) Independently performs ADLs?: Yes (appropriate for developmental age) Is the patient deaf or have difficulty hearing?: Yes (HOH has hearing aides) Does the patient have difficulty seeing, even when wearing glasses/contacts?: Yes (glasses) Does the patient have difficulty concentrating,  remembering, or making decisions?: No  Permission Sought/Granted                  Emotional Assessment Appearance:: Appears stated age Attitude/Demeanor/Rapport: Engaged, Gracious Affect (typically observed): Accepting, Appropriate, Calm, Pleasant Orientation: : Oriented to Self, Oriented to Place, Oriented to  Time, Oriented to Situation Alcohol / Substance Use: Not Applicable Psych Involvement: No (comment)  Admission diagnosis:  Shortness of breath [R06.02] Acute pulmonary edema (HCC) [J81.0] New onset of congestive heart failure (HCC) [I50.9] Patient Active Problem List   Diagnosis Date Noted   Acute on chronic heart failure with preserved ejection fraction (HFpEF) (HCC) 03/31/2024   Hypertensive emergency 03/29/2024   Elevated troponin level not due to acute coronary syndrome 03/29/2024   Acute pulmonary edema (HCC) 03/27/2024   Hypertensive urgency 03/27/2024   AKI (acute kidney injury) 03/27/2024   Type 2 diabetes mellitus without complications (HCC) 03/27/2024   BPH (benign prostatic hyperplasia) 03/27/2024   CKD (chronic kidney disease) stage 4, GFR 15-29 ml/min (HCC) 12/13/2022   Severe obesity (BMI 35.0-35.9 with comorbidity) (HCC) 12/13/2022   Malignant neoplasm of kidney (HCC) 06/21/2021   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   Benign microscopic hematuria 07/05/2020   Stage 3a chronic kidney disease (HCC) 02/29/2020   Medicare annual wellness visit, initial 07/24/2017   Type 2 diabetes mellitus with diabetic nephropathy, without long-term current use of insulin  (HCC) 07/24/2017   Macroalbuminuric diabetic nephropathy (HCC)  07/17/2016   Benign essential HTN 01/07/2014   Degenerative lumbar spinal stenosis 01/07/2014   PCP:  Cleotilde Oneil FALCON, MD Pharmacy:   [facility] [phone] - [place],  - [address] AT [facility] [address] [place] [address] Phone: [phone] Fax: [phone]     Social Drivers of Health  (SDOH) Social History: SDOH Screenings   Food Insecurity: No Food Insecurity (03/28/2024)  Housing: Low Risk (03/28/2024)  Transportation Needs: No Transportation Needs (03/28/2024)  Utilities: Not At Risk (03/28/2024)  Financial Resource Strain: Low Risk  (12/17/2023)   [facility] from [facility]  Social Connections: Moderately Isolated (03/28/2024)  Tobacco Use: High Risk (03/31/2024)   SDOH Interventions:     Readmission Risk Interventions    04/01/2024   11:00 AM  Readmission Risk Prevention Plan  Transportation Screening Complete  PCP or Specialist Appt within 3-5 Days Complete  Social Work Consult for Recovery Care Planning/Counseling Complete  Palliative Care Screening Not Applicable  Medication Review Oceanographer) Complete

## 2024-04-01 NOTE — Progress Notes (Signed)
 [facility]  ROUNDING NOTE   Subjective:   Patient was in good spirits this a.m. Creatinine down to 3.7.  Lab Results  Component Value Date   CREATININE 3.73 (H) 04/01/2024   CREATININE 3.83 (H) 03/31/2024   CREATININE 4.05 (H) 03/30/2024     Objective:  Vital signs in last 24 hours:  Temp:  [98 F (36.7 C)-98.3 F (36.8 C)] 98 F (36.7 C) (12/17 1147) Pulse Rate:  [63-72] 63 (12/17 1147) Resp:  [20] 20 (12/17 0344) BP: (143-185)/(56-95) 146/56 (12/17 1147) SpO2:  [94 %-97 %] 96 % (12/17 1147) Weight:  [99.3 kg] 99.3 kg (12/17 0517)  Weight change: 0.2 kg Filed Weights   03/30/24 0500 03/31/24 0500 04/01/24 0517  Weight: 99.8 kg 99.1 kg 99.3 kg    Intake/Output: I/O last 3 completed shifts: In: 600 [P.O.:600] Out: 1300 [Urine:1300]   Intake/Output this shift:  Total I/O In: 420 [P.O.:420] Out: 300 [Urine:300]  Physical Exam: General: No acute distress  Head: Normocephalic, atraumatic. Moist oral mucosal membranes  Neck: Supple  Lungs:  Clear to auscultation, normal effort  Heart: S1S2 no rubs  Abdomen:  Soft, nontender, bowel sounds present  Extremities: Trace peripheral edema.  Neurologic: Awake, alert, following commands  Skin: No acute rash  Access: No hemodialysis access    Basic Metabolic Panel: Recent Labs  Lab 03/28/24 0345 03/29/24 0136 03/30/24 1038 03/31/24 0843 04/01/24 0450  NA 137 138 137 138 138  K 4.0 4.0 4.0 3.9 3.7  CL 103 101 100 102 102  CO2 21* 23 25 24 22   GLUCOSE 149* 145* 222* 219* 136*  BUN 56* 56* 59* 53* 52*  CREATININE 3.41* 3.77* 4.05* 3.83* 3.73*  CALCIUM 8.5* 9.0 8.6* 8.9 8.3*  MG  --  2.1  --  2.4  --     Liver Function Tests: Recent Labs  Lab 03/29/24 0136 03/30/24 1038 03/31/24 0843 04/01/24 0450  AST 12*  --  16 15  ALT 21  --  16 17  ALKPHOS 70  --  69 64  BILITOT 0.5  --  0.5 0.4  PROT 6.4*  --  6.4* 5.9*  ALBUMIN 3.6 3.6 3.7 3.3*   No results for input(s): LIPASE, AMYLASE  in the last 168 hours. No results for input(s): AMMONIA in the last 168 hours.  CBC: Recent Labs  Lab 03/27/24 1456 03/27/24 2054 03/29/24 0136 03/30/24 1038 03/31/24 0416  WBC 14.7* 16.6* 17.6* 15.7* 14.4*  HGB 12.9* 13.5 12.7* 11.7* 11.4*  HCT 39.3 39.9 36.8* 35.4* 34.4*  MCV 93.6 92.1 90.9 94.1 94.8  PLT 364 357 317 301 270    Cardiac Enzymes: No results for input(s): CKTOTAL, CKMB, CKMBINDEX, TROPONINI in the last 168 hours.  BNP: Invalid input(s): POCBNP  CBG: Recent Labs  Lab 03/31/24 1616 03/31/24 2015 03/31/24 2033 04/01/24 0730 04/01/24 1148  GLUCAP 148* 187* 170* 161* 195*    Microbiology: Results for orders placed or performed during the hospital encounter of 03/14/21  Urine Culture     Status: None   Collection Time: 03/14/21 12:39 PM   Specimen: PATH Cytology Urine  Result Value Ref Range Status   Specimen Description   Final    URINE, RANDOM Performed at [facility], [address] [place], [place] [address]    Special Requests   Final    NONE Performed at [facility], [address], [place], [place] [address]    Culture   Final    NO GROWTH  Performed at [facility], [address], [place], [place] [address]    Report Status 03/15/2021 FINAL  Final    Coagulation Studies: No results for input(s): LABPROT, INR in the last 72 hours.   Urinalysis: Recent Labs    03/30/24 1845  COLORURINE YELLOW*  LABSPEC 1.014  PHURINE 6.0  GLUCOSEU >=500*  HGBUR NEGATIVE  BILIRUBINUR NEGATIVE  KETONESUR NEGATIVE  PROTEINUR >=300*  NITRITE NEGATIVE  LEUKOCYTESUR NEGATIVE      Imaging: DG Chest 2 View Result Date: 03/31/2024 CLINICAL DATA:  Dyspnea EXAM: CHEST - 2 VIEW COMPARISON:  Four days ago FINDINGS: Stable cardiomediastinal silhouette. Improved bilateral pulmonary edema compared to prior exam. Small pleural effusions are noted. IMPRESSION: Improved bilateral pulmonary edema. Small pleural  effusions. Electronically Signed   By: Lynwood Landy Raddle M.D.   On: 03/31/2024 09:54     Medications:      amLODipine   10 mg Oral Daily   aspirin  EC  81 mg Oral Daily   carvedilol   12.5 mg Oral BID WC   heparin  injection (subcutaneous)  5,000 Units Subcutaneous Q12H   hydrALAZINE   100 mg Oral TID   Influenza vac split trivalent PF  0.5 mL Intramuscular Tomorrow-1000   insulin  aspart  0-5 Units Subcutaneous QHS   insulin  aspart  0-9 Units Subcutaneous TID WC   isosorbide  mononitrate  30 mg Oral BID   latanoprost   1 drop Both Eyes QHS   multivitamin with minerals  1 tablet Oral Daily   sodium chloride  flush  3 mL Intravenous Q12H   timolol   1 drop Both Eyes BID   acetaminophen , ALPRAZolam , hydrALAZINE , labetalol , ondansetron  (ZOFRAN ) IV, sodium chloride  flush, traZODone   Assessment/ Plan:  89 y.o. male with a PMHx of diabetes mellitus type 2, hypertension, glaucoma, lumbar spinal stenosis, chronic kidney disease stage IV with history of right-sided nephrectomy, anemia of chronic kidney disease, BPH who was admitted to [facility] on 03/27/2024 for evaluation of worsening lower extremity edema and dyspnea with exertion.    1.  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  Baseline eGFR 17. Suspect renal function altered now due to cardiorenal syndrome.  Renal ultrasound negative for hydronephrosis. Lab Results  Component Value Date   CREATININE 3.73 (H) 04/01/2024   CREATININE 3.83 (H) 03/31/2024   CREATININE 4.05 (H) 03/30/2024   Update: Renal function continues to improve.  Creatinine down to 3.7.  He will need follow-up in our clinic.  2.  Hypertension.  Maintain the patient on amlodipine , carvedilol , hydralazine  hypertension control.  3.  Anemia chronic kidney disease.   Lab Results  Component Value Date   HGB 11.4 (L) 03/31/2024   Hemoglobin 11.4.  No immediate need for Epogen.   LOS:  5 Freda Jaquith 12/17/20254:49 PM

## 2024-04-01 NOTE — Progress Notes (Signed)
 Rounding Note   Patient Name: Timothy Duke Date of Encounter: 04/01/2024  [facility] Cardiologist: None   Subjective Patient reports feeling well with breathing overall feeling back to baseline. He has been up to walk to the bathroom and reports his legs feeling weak. Kidney function continues to improve. Repeat CXR done yesterday showed improvement in pulmonary edema with small pleural effusions.   Scheduled Meds:  amLODipine   10 mg Oral Daily   aspirin  EC  81 mg Oral Daily   carvedilol   12.5 mg Oral BID WC   heparin  injection (subcutaneous)  5,000 Units Subcutaneous Q12H   hydrALAZINE   100 mg Oral TID   Influenza vac split trivalent PF  0.5 mL Intramuscular Tomorrow-1000   insulin  aspart  0-5 Units Subcutaneous QHS   insulin  aspart  0-9 Units Subcutaneous TID WC   isosorbide  mononitrate  30 mg Oral BID   latanoprost   1 drop Both Eyes QHS   multivitamin with minerals  1 tablet Oral Daily   sodium chloride  flush  3 mL Intravenous Q12H   timolol   1 drop Both Eyes BID   Continuous Infusions:   PRN Meds: acetaminophen , ALPRAZolam , hydrALAZINE , labetalol , ondansetron  (ZOFRAN ) IV, sodium chloride  flush, traZODone    Vital Signs  Vitals:   04/01/24 0344 04/01/24 0517 04/01/24 0628 04/01/24 0731  BP: (!) 178/95 (!) 178/66 (!) 160/83 (!) 185/85  Pulse: 69   72  Resp: 20     Temp: 98.3 F (36.8 C)   98.2 F (36.8 C)  TempSrc:    Oral  SpO2: 95%   96%  Weight:  99.3 kg    Height:        Intake/Output Summary (Last 24 hours) at 04/01/2024 0933 Last data filed at 04/01/2024 0600 Gross per 24 hour  Intake 480 ml  Output 800 ml  Net -320 ml      04/01/2024    5:17 AM 03/31/2024    5:00 AM 03/30/2024    5:00 AM  Last 3 Weights  Weight (lbs) 218 lb 14.7 oz 218 lb 7.6 oz 220 lb 0.3 oz  Weight (kg) 99.3 kg 99.1 kg 99.8 kg      Telemetry Normal sinus rhythm with occasional PACs and PVCs- Personally Reviewed  Physical Exam  GEN: No acute distress.    Neck: No JVD Cardiac: RRR with frequent ectopy, no murmurs, rubs, or gallops.  Respiratory: Clear to auscultation bilaterally GI: Soft, nontender, non-distended  MS: No edema; No deformity. Neuro:  Nonfocal  Psych: Normal affect   Labs High Sensitivity Troponin:  No results for input(s): TROPONINIHS in the last 720 hours.  Recent Labs  Lab 03/27/24 1456 03/27/24 1656 03/27/24 2355 03/28/24 0345  TRNPT 54* 53* 58* 62*       Chemistry Recent Labs  Lab 03/29/24 0136 03/30/24 1038 03/31/24 0843 04/01/24 0450  NA 138 137 138 138  K 4.0 4.0 3.9 3.7  CL 101 100 102 102  CO2 23 25 24 22   GLUCOSE 145* 222* 219* 136*  BUN 56* 59* 53* 52*  CREATININE 3.77* 4.05* 3.83* 3.73*  CALCIUM 9.0 8.6* 8.9 8.3*  MG 2.1  --  2.4  --   PROT 6.4*  --  6.4* 5.9*  ALBUMIN 3.6 3.6 3.7 3.3*  AST 12*  --  16 15  ALT 21  --  16 17  ALKPHOS 70  --  69 64  BILITOT 0.5  --  0.5 0.4  GFRNONAA 15* 13* 14* 15*  ANIONGAP 14  12 12 15     Lipids No results for input(s): CHOL, TRIG, HDL, LABVLDL, LDLCALC, CHOLHDL in the last 168 hours.  Hematology Recent Labs  Lab 03/29/24 0136 03/30/24 1038 03/31/24 0416  WBC 17.6* 15.7* 14.4*  RBC 4.05* 3.76* 3.63*  HGB 12.7* 11.7* 11.4*  HCT 36.8* 35.4* 34.4*  MCV 90.9 94.1 94.8  MCH 31.4 31.1 31.4  MCHC 34.5 33.1 33.1  RDW 13.2 13.6 13.3  PLT 317 301 270   Thyroid No results for input(s): TSH, FREET4 in the last 168 hours.  BNP Recent Labs  Lab 03/27/24 1456  PROBNP 15,059.0*    DDimer No results for input(s): DDIMER in the last 168 hours.   Radiology  US  RENAL Result Date: 03/29/2024 IMPRESSION: 1. There is mild left-sided pelviectasis, favored similar compared to priors. 2. There is a 11 mm nonobstructing nephrolithiasis or cortical calcification in the left kidney. 3. Increased echogenicity of the left kidney as can be seen in medical renal disease. 4. Circumferential bladder wall prominence may be secondary to  underdistention. Recommend correlation with urinalysis. Electronically Signed   By: Corean Salter M.D.   On: 03/29/2024 18:48   Cardiac Studies  03/29/2024 Echo complete 1. Left ventricular ejection fraction, by estimation, is 55 to 60%. The  left ventricle has normal function. The left ventricle has no regional  wall motion abnormalities. There is mild concentric left ventricular  hypertrophy. Indeterminate diastolic filling due to E-A fusion.   2. Right ventricular systolic function is normal. The right ventricular  size is normal.   3. Left atrial size was mildly dilated.   4. The mitral valve is normal in structure. Trivial mitral valve  regurgitation. No evidence of mitral stenosis.   5. The aortic valve was not well visualized. There is moderate  calcification of the aortic valve. Aortic valve regurgitation is trivial.  Aortic valve sclerosis/calcification is present, without any evidence of  aortic stenosis.   Patient Profile   89 y.o. 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  Assessment & Plan   Acute on chronic diastolic heart failure - Unclear chronicity - Likely multifactorial with hypertensive emergency and advanced renal disease - Echo revealed EF 55 to 60%, no RWMA, mild concentric LVH, indeterminate diastolic filling due to E-A fusion, normal RV function, and no significant valvular abnormalities - Symptoms improved with IV Lasix  but worsened renal function.  Subsequently started on IV fluids per nephrology. - Repeat chest x-ray 12/16 improved from prior - GDMT limited by renal dysfunction - Continue to hold Lasix  - Nephrology following  Elevated troponin Hypertensive emergency - Troponin mildly elevated and flat trending, not consistent with ACS - BP  201/82 on admission - Echo with preserved LV systolic function and no RWMA - No plan for ischemic evaluation, especially given renal dysfunction - On clonidine  PTA, would commend avoiding this moving forward given poor renal clearance and risk for rebound hypertension as well as advanced age - BP remains elevated, although overall improving - Continue amlodipine  10 mg daily, carvedilol  12.5 mg twice daily, and hydralazine  100 mg 3 times daily - Start isosorbide  mononitrate 30 mg daily for better BP control  Paroxysmal atrial fibrillation - Has historically not been anticoagulated long-term.  Unclear frequency of events, only documented postoperatively in 2023. - CHA2DS2-VASc at least 5 - No  atrial fibrillation noted this admission - Would recommend anticoagulation long-term if A-fib were to recur  CKD stage IV - Kidney function continues to improve - Will need to monitor volume status closely. Recommend net even fluid balance.  - Nephrology following  PVCs - Continue telemetry monitoring - Echo with preserved LVEF - Continue carvedilol  as above  For questions or updates, please contact [facility] Please consult [email or web address] for contact info under       Signed, Lesley LITTIE Maffucci, PA-C  04/01/2024, 9:33 AM

## 2024-04-01 NOTE — Plan of Care (Signed)

## 2024-04-22 ENCOUNTER — Telehealth: Payer: Self-pay | Admitting: Family

## 2024-04-22 NOTE — Progress Notes (Signed)
 "  [facility] Note   Referring Physician: 12/25 PCP: Cleotilde Oneil FALCON, MD Cardiologist: [facility] saw during 12/25 admission  Chief Complaint: shortness of breath   HPI:  Timothy Duke is a 89 y/o male with a history of DM, HTN, glaucoma, lumbar spine stenosis, history of A-fib not on anticoagulation, HFpEF.    Admitted 03/27/24 with acute onset of worsening dyspnea with associated orthopnea and paroxysmal nocturnal dyspnea since last night. The patient admitted to worsening lower extremity edema and dyspnea on exertion. He has been having dry cough and wheezing. In the ER, BP was 121/82 with otherwise normal vital signs. Labs revealed a BUN of 55 with creatinine 3.24 and proBNP 15,059, high-sensitivity troponin was 54 and later 53. CBC showed leukocytosis 14.7 with hemoglobin 12.9 hematocrit 39.3. EKG showed sinus rhythm with a rate of 69 with occasional premature ventricular complexes, left axis deviation and septal Q waves. 2 view chest x-ray showed bilateral pulmonary edema with small pleural effusions. Noncontrast head CT scan showed no acute intracranial normalities. IV diuresed, cardiology & nephrology consulted. Elevated troponin thought to be due to demand ischemia. Not a cath candidate due to renal function. Echo 03/29/24: EF 55-60%, mild LVH, normal RV, indeterminate diastolic filling due to E-A fusion, moderate calcification of aortic valve without stenosis.   He presents today, with his son, for his initial TOC HF visit with a chief complaint of minimal shortness of breath which is improving. Has associated fatigue, pedal edema. Sleeping ok but due to head congestion has to get up at sleep in the recliner. Denies having to get up due to being more SOB. Denies chest pain, palpitations, dizziness. Daughter fixes a weekly pill box for him. Weighing daily.   Denies tobacco, alcohol, drug use.   Review of Systems: [y] = yes, [ ]  = no   General: Weight gain [ ] ; Weight loss [ ] ;  Anorexia [ ] ; Fatigue davis.dad ]; Fever [ ] ; Chills [ ] ; Weakness [ ]   Cardiac: Chest pain/pressure [ ] ; Resting SOB [ ] ; Exertional SOB [ y]; Orthopnea [ ] ; Pedal Edema davis.dad ]; Palpitations [ ] ; Syncope [ ] ; Presyncope [ ] ; Paroxysmal nocturnal dyspnea[ ]   Pulmonary: Cough [ ] ; Wheezing[ ] ; Hemoptysis[ ] ; Sputum [ ] ; Snoring [ ]   GI: Vomiting[ ] ; Dysphagia[ ] ; Melena[ ] ; Hematochezia [ ] ; Heartburn[ ] ; Abdominal pain [ ] ; Constipation [ ] ; Diarrhea [ ] ; BRBPR [ ]   GU: Hematuria[ ] ; Dysuria [ ] ; Nocturia[ ]   Vascular: Pain in legs with walking [ ] ; Pain in feet with lying flat [ ] ; Non-healing sores [ ] ; Stroke [ ] ; TIA [ ] ; Slurred speech [ ] ;  Neuro: Headaches[ ] ; Vertigo[ ] ; Seizures[ ] ; Paresthesias[ ] ;Blurred vision [ ] ; Diplopia [ ] ; Vision changes [ ]   Ortho/Skin: Arthritis [ ] ; Joint pain [ ] ; Muscle pain [ ] ; Joint swelling [ ] ; Back Pain [ ] ; Rash [ ]   Psych: Depression[ ] ; Anxiety[ ]   Heme: Bleeding problems [ ] ; Clotting disorders [ ] ; Anemia [ ]   Endocrine: Diabetes davis.dad ]; Thyroid dysfunction[ ]    Past Medical History:  Diagnosis Date   BPH (benign prostatic hyperplasia)    Chronic kidney disease (CKD), stage IV (severe) (HCC)    Degenerative lumbar spinal stenosis    Diabetes mellitus without complication (HCC)    Glaucoma    Hypertension    PAF (paroxysmal atrial fibrillation) (HCC)    Urothelial cancer (HCC)     Current Outpatient Medications  Medication Sig  Dispense Refill   Acetaminophen  500 MG coapsule Take 500 mg by mouth daily.     amLODipine  (NORVASC ) 10 MG tablet Take by mouth.     aspirin  EC 81 MG tablet Take 81 mg by mouth daily.     carvedilol  (COREG ) 12.5 MG tablet Take 1 tablet (12.5 mg total) by mouth 2 (two) times daily with a meal. 60 tablet 2   glipiZIDE  (GLUCOTROL  XL) 10 MG 24 hr tablet Take 10 mg by mouth daily with breakfast.     glucose blood test strip USE AS DIRECTED TWICE DAILY     hydrALAZINE  (APRESOLINE ) 50 MG tablet Take 2 tablets (100 mg total) by  mouth 2 (two) times daily. 60 tablet 2   isosorbide  mononitrate (IMDUR ) 30 MG 24 hr tablet Take 1 tablet (30 mg total) by mouth 2 (two) times daily. 60 tablet 2   latanoprost  (XALATAN ) 0.005 % ophthalmic solution Place 1 drop into both eyes at bedtime.     Multiple Vitamin (MULTI-VITAMINS) TABS Take 1 tablet by mouth daily.     OZEMPIC, 1 MG/DOSE, 4 MG/3ML SOPN Inject 1 mg into the skin once a week.     PRODIGY NO CODING BLOOD GLUC test strip 2 (two) times daily. as directed     PRODIGY TWIST TOP LANCETS 28G MISC USE TO CHECK BLOOD SUGAR TWICE DAILY AS INSTRUCTED     timolol  (TIMOPTIC ) 0.5 % ophthalmic solution Place 1 drop into both eyes 2 (two) times daily.     traZODone  (DESYREL ) 50 MG tablet Take 0.5 tablets (25 mg total) by mouth at bedtime as needed for sleep. 30 tablet 0   No current facility-administered medications for this visit.    Allergies[1]    Social History   Socioeconomic History   Marital status: Widowed    Spouse name: Not on file   Number of children: Not on file   Years of education: Not on file   Highest education level: Not on file  Occupational History   Not on file  Tobacco Use   Smoking status: Never   Smokeless tobacco: Current    Types: Chew  Vaping Use   Vaping status: Never Used  Substance and Sexual Activity   Alcohol use: No   Drug use: No   Sexual activity: Not on file  Other Topics Concern   Not on file  Social History Narrative   Not on file   Social Drivers of Health   Tobacco Use: High Risk (04/14/2024)   Received from [facility]   Patient History    Smoking Tobacco Use: Former    Smokeless Tobacco Use: Current    Passive Exposure: Past  Physicist, Medical Strain: Low Risk  (12/17/2023)   Received from [facility]   Overall Financial Resource Strain (CARDIA)    Difficulty of Paying Living Expenses: Not hard at all  Food Insecurity: No Food Insecurity (03/28/2024)   Epic    Worried About  Radiation Protection Practitioner of Food in the Last Year: Never true    Ran Out of Food in the Last Year: Never true  Transportation Needs: No Transportation Needs (03/28/2024)   Epic    Lack of Transportation (Medical): No    Lack of Transportation (Non-Medical): No  Physical Activity: Not on file  Stress: Not on file  Social Connections: Moderately Isolated (03/28/2024)   Social Connection and Isolation Panel    Frequency of Communication with Friends and Family: Three times a week  Frequency of Social Gatherings with Friends and Family: Three times a week    Attends Religious Services: 1 to 4 times per year    Active Member of Clubs or Organizations: No    Attends Banker Meetings: Never    Marital Status: Widowed  Intimate Partner Violence: Not At Risk (03/28/2024)   Epic    Fear of Current or Ex-Partner: No    Emotionally Abused: No    Physically Abused: No    Sexually Abused: No  Depression (PHQ2-9): Not on file  Alcohol Screen: Not on file  Housing: Low Risk (03/28/2024)   Epic    Unable to Pay for Housing in the Last Year: No    Number of Times Moved in the Last Year: 0    Homeless in the Last Year: No  Utilities: Not At Risk (03/28/2024)   Epic    Threatened with loss of utilities: No  Health Literacy: Not on file      Family History  Problem Relation Age of Onset   Stroke Mother    Hypertension Mother    Diabetes Mother    Cancer Mother    Heart attack Brother    Vitals:   04/23/24 0831  BP: (!) 162/67  Pulse: 70  SpO2: 98%  Weight: 234 lb 6.4 oz (106.3 kg)   Wt Readings from Last 3 Encounters:  04/23/24 234 lb 6.4 oz (106.3 kg)  04/01/24 218 lb 14.7 oz (99.3 kg)  06/21/23 223 lb (101.2 kg)   Lab Results  Component Value Date   CREATININE 3.73 (H) 04/01/2024   CREATININE 3.83 (H) 03/31/2024   CREATININE 4.05 (H) 03/30/2024    PHYSICAL EXAM: General: Well appearing.  Cor: No JVD. Regular rhythm, rate.  Lungs: clear Abdomen: soft, nontender,  nondistended. Extremities: 2+ pitting edema bilateral lower legs Neuro:. Affect pleasant   ECG 03/27/24: NSR with PVC's    ASSESSMENT & PLAN:  1: HFpEF- - suspect due to uncontrolled HTN - NYHA class II - euvolemic - weighing daily. Reviewed parameters to call about - Echo 03/29/24: EF 55-60%, mild LVH, normal RV, indeterminate diastolic filling due to E-A fusion, moderate calcification of aortic valve without stenosis.  - unable to use MRA, SGLT2 due to CKD - no current need for diuretics - get compression socks and put them on daily with removal at bedtime - already elevates his legs when sitting for long periods of time - pro-BNP 03/27/24 was 15059.0  2: HTN- - BP 162/67 - increase carvedilol  to 25mg  BID  - saw PCP Ted) 12/25 - continue amlodipine , hydralazine , isosorbide  - CKD limits use of MRA, ARB, ARNi - BMET 04/06/24 reviewed: K+ 5.5, creatinine 4.23, GFR 13  3: DM (managed by PCP)- - A1c 03/27/24 was 7.7%  4: CKD- - saw nephrology Geoffry) 12/25; returns next week - creatinine 4.23 on 04/06/24   Return in 2 months, sooner if needed. If doing ok at that time, will have him return PRN  I spent 31 minutes reviewing records, interviewing/ examing patient and managing plan/ orders.   Ellouise DELENA Class, FNP 04/22/2024     [1] No Known Allergies  "

## 2024-04-22 NOTE — Telephone Encounter (Signed)
 Called to confirm/remind patient of their appointment at the [facility] on 1/826.   Appointment:   [x] Confirmed  [] Left mess   [] No answer/No voice mail  [] VM Full/unable to leave message  [] Phone not in service  Patient reminded to bring all medications and/or complete list.  Confirmed patient has transportation. Gave directions, instructed to utilize valet parking.

## 2024-04-23 ENCOUNTER — Encounter: Payer: Self-pay | Admitting: Family

## 2024-04-23 ENCOUNTER — Ambulatory Visit: Attending: Family | Admitting: Family

## 2024-04-23 VITALS — BP 162/67 | HR 70 | Wt 234.4 lb

## 2024-04-23 DIAGNOSIS — Z7984 Long term (current) use of oral hypoglycemic drugs: Secondary | ICD-10-CM | POA: Diagnosis not present

## 2024-04-23 DIAGNOSIS — E1122 Type 2 diabetes mellitus with diabetic chronic kidney disease: Secondary | ICD-10-CM | POA: Insufficient documentation

## 2024-04-23 DIAGNOSIS — Z7985 Long-term (current) use of injectable non-insulin antidiabetic drugs: Secondary | ICD-10-CM | POA: Diagnosis not present

## 2024-04-23 DIAGNOSIS — I110 Hypertensive heart disease with heart failure: Secondary | ICD-10-CM | POA: Diagnosis not present

## 2024-04-23 DIAGNOSIS — N184 Chronic kidney disease, stage 4 (severe): Secondary | ICD-10-CM | POA: Diagnosis not present

## 2024-04-23 DIAGNOSIS — R5383 Other fatigue: Secondary | ICD-10-CM | POA: Diagnosis not present

## 2024-04-23 DIAGNOSIS — N185 Chronic kidney disease, stage 5: Secondary | ICD-10-CM | POA: Diagnosis not present

## 2024-04-23 DIAGNOSIS — I358 Other nonrheumatic aortic valve disorders: Secondary | ICD-10-CM | POA: Diagnosis not present

## 2024-04-23 DIAGNOSIS — I5032 Chronic diastolic (congestive) heart failure: Secondary | ICD-10-CM | POA: Insufficient documentation

## 2024-04-23 DIAGNOSIS — F17220 Nicotine dependence, chewing tobacco, uncomplicated: Secondary | ICD-10-CM | POA: Diagnosis not present

## 2024-04-23 DIAGNOSIS — I10 Essential (primary) hypertension: Secondary | ICD-10-CM

## 2024-04-23 DIAGNOSIS — R600 Localized edema: Secondary | ICD-10-CM | POA: Diagnosis not present

## 2024-04-23 DIAGNOSIS — R0602 Shortness of breath: Secondary | ICD-10-CM | POA: Insufficient documentation

## 2024-04-23 DIAGNOSIS — Z79899 Other long term (current) drug therapy: Secondary | ICD-10-CM | POA: Insufficient documentation

## 2024-04-23 MED ORDER — CARVEDILOL 25 MG PO TABS: 25.0000 mg | ORAL_TABLET | Freq: Two times a day (BID) | ORAL | 1 refills | Status: AC

## 2024-04-23 NOTE — Patient Instructions (Signed)
 Medication Changes:  INCREASE Carvedilol  to 25mg  (1 tab) two times daily   Special Instructions // Education:  Please wear compression socks daily and remove them at bedtime.  Follow-Up in: Please follow up with the [facility] in March with Ellouise Class, FNP.   Thank you for choosing [facility].    At the [facility], you and your health needs are our priority. We have a designated team specialized in the treatment of Heart Failure. This Care Team includes your primary Heart Failure Specialized Cardiologist (physician), Advanced Practice Providers (APPs- Physician Assistants and Nurse Practitioners), and Pharmacist who all work together to provide you with the care you need, when you need it.   You may see any of the following providers on your designated Care Team at your next follow up:  Dr. Toribio Fuel Dr. Ezra Shuck Dr. Ria Commander Dr. Morene Brownie Ellouise Class, FNP Jaun Bash, RPH-CPP  Please be sure to bring in all your medications bottles to every appointment.   Need to Contact Us :  If you have any questions or concerns before your next appointment please send us  a message through [place] or call our office at [phone].    TO LEAVE A MESSAGE FOR THE NURSE SELECT OPTION 2, PLEASE LEAVE A MESSAGE INCLUDING: YOUR NAME DATE OF BIRTH CALL BACK NUMBER REASON FOR CALL**this is important as we prioritize the call backs  YOU WILL RECEIVE A CALL BACK THE SAME DAY AS LONG AS YOU CALL BEFORE 4:00 PM

## 2024-06-24 ENCOUNTER — Ambulatory Visit: Admitting: Family

## 2024-06-25 ENCOUNTER — Ambulatory Visit: Admitting: Podiatry
